# Patient Record
Sex: Male | Born: 1980 | Race: White | Hispanic: No | Smoking: Current every day smoker
Health system: Southern US, Community
[De-identification: ages and names within clinical notes are randomized; demographics above are authoritative.]

## PROBLEM LIST (undated history)

## (undated) DIAGNOSIS — H9190 Unspecified hearing loss, unspecified ear: Secondary | ICD-10-CM

## (undated) DIAGNOSIS — F32A Depression, unspecified: Secondary | ICD-10-CM

## (undated) DIAGNOSIS — F329 Major depressive disorder, single episode, unspecified: Secondary | ICD-10-CM

## (undated) HISTORY — PX: APPENDECTOMY: SHX54

---

## 2001-08-09 ENCOUNTER — Encounter: Payer: Self-pay | Admitting: Emergency Medicine

## 2001-08-09 ENCOUNTER — Emergency Department (HOSPITAL_COMMUNITY): Admission: EM | Admit: 2001-08-09 | Discharge: 2001-08-09 | Payer: Self-pay | Admitting: Emergency Medicine

## 2003-03-03 ENCOUNTER — Emergency Department (HOSPITAL_COMMUNITY): Admission: EM | Admit: 2003-03-03 | Discharge: 2003-03-03 | Payer: Self-pay | Admitting: Emergency Medicine

## 2004-02-25 ENCOUNTER — Emergency Department (HOSPITAL_COMMUNITY): Admission: EM | Admit: 2004-02-25 | Discharge: 2004-02-26 | Payer: Self-pay | Admitting: *Deleted

## 2004-02-26 ENCOUNTER — Ambulatory Visit (HOSPITAL_COMMUNITY): Admission: RE | Admit: 2004-02-26 | Discharge: 2004-02-26 | Payer: Self-pay | Admitting: *Deleted

## 2012-01-06 ENCOUNTER — Telehealth (HOSPITAL_COMMUNITY): Payer: Self-pay | Admitting: Physician Assistant

## 2012-01-06 NOTE — Telephone Encounter (Signed)
Error

## 2016-09-20 ENCOUNTER — Encounter (HOSPITAL_COMMUNITY): Payer: Self-pay | Admitting: Emergency Medicine

## 2016-09-20 ENCOUNTER — Emergency Department (HOSPITAL_COMMUNITY): Payer: BLUE CROSS/BLUE SHIELD

## 2016-09-20 ENCOUNTER — Emergency Department (HOSPITAL_COMMUNITY)
Admission: EM | Admit: 2016-09-20 | Discharge: 2016-09-21 | Disposition: A | Discharge status: Home / Self Care | Payer: BLUE CROSS/BLUE SHIELD | Attending: Emergency Medicine | Admitting: Emergency Medicine

## 2016-09-20 DIAGNOSIS — Y999 Unspecified external cause status: Secondary | ICD-10-CM | POA: Diagnosis not present

## 2016-09-20 DIAGNOSIS — Y939 Activity, unspecified: Secondary | ICD-10-CM | POA: Insufficient documentation

## 2016-09-20 DIAGNOSIS — F17200 Nicotine dependence, unspecified, uncomplicated: Secondary | ICD-10-CM | POA: Insufficient documentation

## 2016-09-20 DIAGNOSIS — Y92410 Unspecified street and highway as the place of occurrence of the external cause: Secondary | ICD-10-CM | POA: Insufficient documentation

## 2016-09-20 DIAGNOSIS — S060X0A Concussion without loss of consciousness, initial encounter: Secondary | ICD-10-CM | POA: Diagnosis not present

## 2016-09-20 DIAGNOSIS — S0990XA Unspecified injury of head, initial encounter: Secondary | ICD-10-CM | POA: Diagnosis present

## 2016-09-20 HISTORY — DX: Unspecified hearing loss, unspecified ear: H91.90

## 2016-09-20 LAB — COMPREHENSIVE METABOLIC PANEL
ALBUMIN: 4.4 g/dL (ref 3.5–5.0)
ALK PHOS: 62 U/L (ref 38–126)
ALT: 21 U/L (ref 17–63)
ANION GAP: 12 (ref 5–15)
AST: 28 U/L (ref 15–41)
BUN: 8 mg/dL (ref 6–20)
CHLORIDE: 98 mmol/L — AB (ref 101–111)
CO2: 24 mmol/L (ref 22–32)
Calcium: 9 mg/dL (ref 8.9–10.3)
Creatinine, Ser: 1.16 mg/dL (ref 0.61–1.24)
GFR calc Af Amer: >60 mL/min (ref >60)
GFR calc non Af Amer: >60 mL/min (ref >60)
GLUCOSE: 102 mg/dL — AB (ref 65–99)
Potassium: 3.5 mmol/L (ref 3.5–5.1)
SODIUM: 134 mmol/L — AB (ref 135–145)
Total Bilirubin: 0.5 mg/dL (ref 0.3–1.2)
Total Protein: 6.9 g/dL (ref 6.5–8.1)

## 2016-09-20 LAB — CBC
HEMATOCRIT: 41.5 % (ref 39.0–52.0)
HEMOGLOBIN: 14.3 g/dL (ref 13.0–17.0)
MCH: 31.6 pg (ref 26.0–34.0)
MCHC: 34.5 g/dL (ref 30.0–36.0)
MCV: 91.8 fL (ref 78.0–100.0)
Platelets: 194 10*3/uL (ref 150–400)
RBC: 4.52 MIL/uL (ref 4.22–5.81)
RDW: 13 % (ref 11.5–15.5)
WBC: 12.2 10*3/uL — ABNORMAL HIGH (ref 4.0–10.5)

## 2016-09-20 MED ORDER — ONDANSETRON 4 MG PO TBDP
4.0000 mg | ORAL_TABLET | Freq: Once | ORAL | Status: AC | PRN
  Administered 2016-09-20: 4 mg via ORAL

## 2016-09-20 MED ORDER — ONDANSETRON 4 MG PO TBDP
ORAL_TABLET | ORAL | Status: AC
  Administered 2016-09-20: 4 mg via ORAL
  Filled 2016-09-20: qty 1

## 2016-09-20 NOTE — ED Notes (Signed)
Interpreter has arrived.

## 2016-09-20 NOTE — ED Notes (Signed)
Interpreter present at this time and notifying pt the he will be moved to room as quickly as possible.

## 2016-09-20 NOTE — ED Notes (Signed)
Pt has a friend here to help with sign interpreting, but the friend has requested we call for an interpreter. Secretary is currently calling for a sign language interpreter.
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
## 2016-09-20 NOTE — ED Notes (Signed)
Contacted sign language interpreter
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
## 2016-09-21 NOTE — ED Notes (Signed)
Pt verbalized understanding of d/c instructions and has no further questions. Pt is stable, A&Ox4, VSS.  

## 2017-01-28 ENCOUNTER — Ambulatory Visit
Admission: RE | Admit: 2017-01-28 | Discharge: 2017-01-28 | Disposition: A | Discharge status: Home / Self Care | Payer: BLUE CROSS/BLUE SHIELD | Source: Ambulatory Visit | Attending: Family Medicine | Admitting: Family Medicine

## 2017-01-28 ENCOUNTER — Other Ambulatory Visit: Payer: Self-pay | Admitting: Family Medicine

## 2017-01-28 DIAGNOSIS — L02415 Cutaneous abscess of right lower limb: Secondary | ICD-10-CM

## 2017-02-25 ENCOUNTER — Other Ambulatory Visit: Payer: Self-pay | Admitting: Family Medicine

## 2017-02-25 DIAGNOSIS — R2241 Localized swelling, mass and lump, right lower limb: Secondary | ICD-10-CM

## 2017-03-03 ENCOUNTER — Ambulatory Visit
Admission: RE | Admit: 2017-03-03 | Discharge: 2017-03-03 | Disposition: A | Discharge status: Home / Self Care | Payer: BLUE CROSS/BLUE SHIELD | Source: Ambulatory Visit | Attending: Family Medicine | Admitting: Family Medicine

## 2017-03-03 DIAGNOSIS — R2241 Localized swelling, mass and lump, right lower limb: Secondary | ICD-10-CM

## 2017-08-01 ENCOUNTER — Emergency Department (HOSPITAL_COMMUNITY)
Admission: EM | Admit: 2017-08-01 | Discharge: 2017-08-02 | Disposition: A | Discharge status: Home / Self Care | Payer: Managed Care, Other (non HMO) | Attending: Emergency Medicine | Admitting: Emergency Medicine

## 2017-08-01 ENCOUNTER — Emergency Department (HOSPITAL_COMMUNITY)
Admission: EM | Admit: 2017-08-01 | Discharge: 2017-08-01 | Discharge status: Against Medical Advice | Payer: BLUE CROSS/BLUE SHIELD

## 2017-08-01 ENCOUNTER — Encounter (HOSPITAL_COMMUNITY): Payer: Self-pay | Admitting: Family Medicine

## 2017-08-01 DIAGNOSIS — F1094 Alcohol use, unspecified with alcohol-induced mood disorder: Secondary | ICD-10-CM | POA: Diagnosis not present

## 2017-08-01 DIAGNOSIS — F329 Major depressive disorder, single episode, unspecified: Secondary | ICD-10-CM | POA: Diagnosis present

## 2017-08-01 DIAGNOSIS — F1290 Cannabis use, unspecified, uncomplicated: Secondary | ICD-10-CM | POA: Diagnosis not present

## 2017-08-01 DIAGNOSIS — F332 Major depressive disorder, recurrent severe without psychotic features: Secondary | ICD-10-CM | POA: Insufficient documentation

## 2017-08-01 DIAGNOSIS — F17210 Nicotine dependence, cigarettes, uncomplicated: Secondary | ICD-10-CM | POA: Diagnosis not present

## 2017-08-01 DIAGNOSIS — F1020 Alcohol dependence, uncomplicated: Secondary | ICD-10-CM | POA: Diagnosis not present

## 2017-08-01 DIAGNOSIS — R45851 Suicidal ideations: Secondary | ICD-10-CM

## 2017-08-01 DIAGNOSIS — F1220 Cannabis dependence, uncomplicated: Secondary | ICD-10-CM | POA: Diagnosis not present

## 2017-08-01 DIAGNOSIS — F1010 Alcohol abuse, uncomplicated: Secondary | ICD-10-CM

## 2017-08-01 HISTORY — DX: Depression, unspecified: F32.A

## 2017-08-01 HISTORY — DX: Major depressive disorder, single episode, unspecified: F32.9

## 2017-08-01 LAB — COMPREHENSIVE METABOLIC PANEL
ALT: 22 U/L (ref 17–63)
AST: 21 U/L (ref 15–41)
Albumin: 4.4 g/dL (ref 3.5–5.0)
Alkaline Phosphatase: 59 U/L (ref 38–126)
Anion gap: 9 (ref 5–15)
BUN: 13 mg/dL (ref 6–20)
CHLORIDE: 107 mmol/L (ref 101–111)
CO2: 25 mmol/L (ref 22–32)
CREATININE: 0.89 mg/dL (ref 0.61–1.24)
Calcium: 9.2 mg/dL (ref 8.9–10.3)
GFR calc non Af Amer: >60 mL/min (ref >60)
Glucose, Bld: 96 mg/dL (ref 65–99)
Potassium: 4.2 mmol/L (ref 3.5–5.1)
SODIUM: 141 mmol/L (ref 135–145)
Total Bilirubin: 0.8 mg/dL (ref 0.3–1.2)
Total Protein: 6.9 g/dL (ref 6.5–8.1)

## 2017-08-01 LAB — CBC
HCT: 40.8 % (ref 39.0–52.0)
HEMOGLOBIN: 14.5 g/dL (ref 13.0–17.0)
MCH: 33.3 pg (ref 26.0–34.0)
MCHC: 35.5 g/dL (ref 30.0–36.0)
MCV: 93.8 fL (ref 78.0–100.0)
Platelets: 202 10*3/uL (ref 150–400)
RBC: 4.35 MIL/uL (ref 4.22–5.81)
RDW: 12.7 % (ref 11.5–15.5)
WBC: 6.2 10*3/uL (ref 4.0–10.5)

## 2017-08-01 LAB — ACETAMINOPHEN LEVEL: Acetaminophen (Tylenol), Serum: <10 ug/mL — ABNORMAL LOW (ref 10–30)

## 2017-08-01 LAB — ETHANOL: Alcohol, Ethyl (B): <10 mg/dL (ref <10)

## 2017-08-01 LAB — SALICYLATE LEVEL: Salicylate Lvl: <7 mg/dL (ref 2.8–30.0)

## 2017-08-01 MED ORDER — NICOTINE 21 MG/24HR TD PT24
21.0000 mg | MEDICATED_PATCH | Freq: Every day | TRANSDERMAL | Status: DC
  Administered 2017-08-01: 21 mg via TRANSDERMAL
  Filled 2017-08-01 (×2): qty 1

## 2017-08-01 MED ORDER — LORAZEPAM 1 MG PO TABS
0.0000 mg | ORAL_TABLET | Freq: Four times a day (QID) | ORAL | Status: DC
  Administered 2017-08-02: 1 mg via ORAL
  Filled 2017-08-01: qty 1

## 2017-08-01 MED ORDER — VITAMIN B-1 100 MG PO TABS
100.0000 mg | ORAL_TABLET | Freq: Every day | ORAL | Status: DC
  Administered 2017-08-02: 100 mg via ORAL
  Filled 2017-08-01: qty 1

## 2017-08-01 MED ORDER — FOLIC ACID 1 MG PO TABS
1.0000 mg | ORAL_TABLET | Freq: Every day | ORAL | Status: DC
  Administered 2017-08-02: 1 mg via ORAL
  Filled 2017-08-01: qty 1

## 2017-08-01 MED ORDER — LORAZEPAM 1 MG PO TABS: 1.0000 mg | ORAL_TABLET | Freq: Four times a day (QID) | ORAL | Status: DC | PRN

## 2017-08-01 MED ORDER — LORAZEPAM 1 MG PO TABS: 0.0000 mg | ORAL_TABLET | Freq: Two times a day (BID) | ORAL | Status: DC

## 2017-08-01 MED ORDER — LORAZEPAM 2 MG/ML IJ SOLN: 1.0000 mg | Freq: Four times a day (QID) | INTRAMUSCULAR | Status: DC | PRN

## 2017-08-01 MED ORDER — THIAMINE HCL 100 MG/ML IJ SOLN: 100.0000 mg | Freq: Every day | INTRAMUSCULAR | Status: DC

## 2017-08-01 MED ORDER — ADULT MULTIVITAMIN W/MINERALS CH
1.0000 | ORAL_TABLET | Freq: Every day | ORAL | Status: DC
  Administered 2017-08-02: 1 via ORAL
  Filled 2017-08-01: qty 1

## 2017-08-01 NOTE — ED Triage Notes (Signed)
Went to triage patient while two other individuals were in the room who are also using sign language. Was going to use [employer] video, which is the serve that [facility] uses. Initially started to use [employer] video, Janeses #[id number]. The interpreter was interrupted by one of the individuals in the room, reporting the video interpreter was not correct with signing. Removed the interpreter video out of the room, spoke with Toniann FailWendy, RN Merchandiser, retail(assistance director) to speak with patient and the other individuals.

## 2017-08-01 NOTE — ED Provider Notes (Signed)
[facility]-EMERGENCY DEPT Provider Note   CSN: [id number] Arrival date & time: 08/01/17  1803     History   Chief Complaint Chief Complaint  Patient presents with  . Psychiatric Evaluation    HPI Jorge ForthDavid M Young is a 37 y.o. male.  Patient with history of depression and deafness presenting with suicidal thoughts and depression and hopelessness.  States he is feeling hopeless and has recently broke up with his wife.  He has been off of his Zoloft for the past month.  He has been abusing alcohol on a regular basis and drinking heavily.  States he drinks about 3 16ounce drinks of 10% alcohol per day, last drink was at 5 PM.  He denies any withdrawal symptoms, shaking or vomiting.  Denies any IV drug use.  Abuse some pain killers 4 days ago but none recently.  Complains of a headache now.  No chest pain or abdominal pain.  No vomiting or diarrhea.   The history is provided by the patient. The history is limited by a language barrier. A language interpreter was used.    Past Medical History:  Diagnosis Date  . Deaf   . Depression     There are no active problems to display for this patient.   Past Surgical History:  Procedure Laterality Date  . APPENDECTOMY         Home Medications    Prior to Admission medications   Not on File    Family History History reviewed. No pertinent family history.  Social History Social History   Tobacco Use  . Smoking status: Current Every Day Smoker    Packs/day: 1.00    Types: Cigarettes  . Smokeless tobacco: Never Used  Substance Use Topics  . Alcohol use: Yes    Comment: Daily. 3 16oz beer a day. Last drink: 17:00   . Drug use: Yes    Types: Marijuana    Comment: Hydrocodone from streets, last used Thursday night. Last used Marjuana 14:00 today.      Allergies   Patient has no allergy information on record.   Review of Systems Review of Systems  Constitutional: Negative for activity change,  appetite change, fatigue and fever.  HENT: Negative for congestion and rhinorrhea.   Eyes: Negative for visual disturbance.  Respiratory: Negative for cough, chest tightness and shortness of breath.   Cardiovascular: Negative for chest pain.  Gastrointestinal: Positive for nausea. Negative for abdominal pain and vomiting.  Genitourinary: Negative for dysuria and hematuria.  Musculoskeletal: Negative for arthralgias and myalgias.  Skin: Negative for rash.  Neurological: Positive for headaches. Negative for dizziness and weakness.  Psychiatric/Behavioral: Positive for decreased concentration, dysphoric mood, self-injury, sleep disturbance and suicidal ideas. The patient is nervous/anxious.     all other systems are negative except as noted in the HPI and PMH.    Physical Exam Updated Vital Signs BP 136/89 (BP Location: Right Arm)   Pulse 88   Temp 98.7 F (37.1 C) (Oral)   Resp 18   Ht 5\' 11"  (1.803 m)   Wt 80.7 kg (178 lb)   SpO2 97%   BMI 24.83 kg/m   Physical Exam  Constitutional: He is oriented to person, place, and time. He appears well-developed and well-nourished. No distress.  Calm and cooperative. No tremors.  HENT:  Head: Normocephalic and atraumatic.  Mouth/Throat: Oropharynx is clear and moist. No oropharyngeal exudate.  Eyes: Conjunctivae and EOM are normal. Pupils are equal, round, and reactive to light.  Neck: Normal range of motion. Neck supple.  No meningismus.  Cardiovascular: Normal rate, regular rhythm, normal heart sounds and intact distal pulses.  No murmur heard. Pulmonary/Chest: Effort normal and breath sounds normal. No respiratory distress. He exhibits no tenderness.  Abdominal: Soft. There is no tenderness. There is no rebound and no guarding.  Musculoskeletal: Normal range of motion. He exhibits no edema or tenderness.  Neurological: He is alert and oriented to person, place, and time. No cranial nerve deficit. He exhibits normal muscle tone.  Coordination normal.  No ataxia on finger to nose bilaterally. No pronator drift. 5/5 strength throughout. CN 2-12 intact.Equal grip strength. Sensation intact.   Skin: Skin is warm. Capillary refill takes less than 2 seconds.  Psychiatric: He has a normal mood and affect. His behavior is normal.  Nursing note and vitals reviewed.    ED Treatments / Results  Labs (all labs ordered are listed, but only abnormal results are displayed) Labs Reviewed  ACETAMINOPHEN LEVEL - Abnormal; Notable for the following components:      Result Value   Acetaminophen (Tylenol), Serum <10 (*)    All other components within normal limits  RAPID URINE DRUG SCREEN, HOSP PERFORMED - Abnormal; Notable for the following components:   Tetrahydrocannabinol POSITIVE (*)    All other components within normal limits  COMPREHENSIVE METABOLIC PANEL  ETHANOL  SALICYLATE LEVEL  CBC    EKG  EKG Interpretation None       Radiology No results found.  Procedures Procedures (including critical care time)  Medications Ordered in ED Medications - No data to display   Initial Impression / Assessment and Plan / ED Course  I have reviewed the triage vital signs and the nursing notes.  Pertinent labs & imaging results that were available during my care of the patient were reviewed by me and considered in my medical decision making (see chart for details).    Patient with depression and vague suicidal thoughts with alcohol abuse.  There is no evidence of alcohol withdrawal at this time.  Will obtain screening labs, alcohol withdrawal protocol  Screening labs are unremarkable.  Drug screen is positive for marijuana.  Patient is medically clear for TTS consult.  Holding orders placed.  Alcohol withdrawal orders placed.  No evidence of life-threatening withdrawal at this time.    Final Clinical Impressions(s) / ED Diagnoses   Final diagnoses:  Suicidal ideation  Alcohol abuse    ED Discharge Orders     None       Hayley Horn, Jeannett Senior, MD 08/02/17 [phone]

## 2017-08-01 NOTE — ED Notes (Signed)
Bed: WA28 Expected date:  Expected time:  Means of arrival:  Comments: 

## 2017-08-01 NOTE — ED Notes (Signed)
Bed: WLPT4 Expected date:  Expected time:  Means of arrival:  Comments: 

## 2017-08-02 DIAGNOSIS — F17210 Nicotine dependence, cigarettes, uncomplicated: Secondary | ICD-10-CM | POA: Diagnosis not present

## 2017-08-02 DIAGNOSIS — F332 Major depressive disorder, recurrent severe without psychotic features: Secondary | ICD-10-CM | POA: Diagnosis not present

## 2017-08-02 DIAGNOSIS — F1290 Cannabis use, unspecified, uncomplicated: Secondary | ICD-10-CM

## 2017-08-02 DIAGNOSIS — F1094 Alcohol use, unspecified with alcohol-induced mood disorder: Secondary | ICD-10-CM | POA: Diagnosis not present

## 2017-08-02 LAB — RAPID URINE DRUG SCREEN, HOSP PERFORMED
Amphetamines: NOT DETECTED
Barbiturates: NOT DETECTED
Benzodiazepines: NOT DETECTED
COCAINE: NOT DETECTED
OPIATES: NOT DETECTED
Tetrahydrocannabinol: POSITIVE — AB

## 2017-08-02 MED ORDER — ACETAMINOPHEN 325 MG PO TABS
650.0000 mg | ORAL_TABLET | ORAL | Status: DC | PRN
  Administered 2017-08-02: 650 mg via ORAL

## 2017-08-02 MED ORDER — ALUM & MAG HYDROXIDE-SIMETH 200-200-20 MG/5ML PO SUSP: 30.0000 mL | Freq: Four times a day (QID) | ORAL | Status: DC | PRN

## 2017-08-02 MED ORDER — ONDANSETRON HCL 4 MG PO TABS: 4.0000 mg | ORAL_TABLET | Freq: Three times a day (TID) | ORAL | Status: DC | PRN

## 2017-08-02 MED ORDER — NICOTINE 21 MG/24HR TD PT24
21.0000 mg | MEDICATED_PATCH | Freq: Every day | TRANSDERMAL | Status: DC
  Administered 2017-08-02: 21 mg via TRANSDERMAL

## 2017-08-02 NOTE — ED Notes (Signed)
Report given to ashley RN.

## 2017-08-02 NOTE — BH Assessment (Signed)
[facility]  Per Juanetta BeetsJacqueline Norman, DO, this pt does not require psychiatric hospitalization at this time.  [facility]  Pt would benefit from seeing Peer Support Specialists; they will be asked to speak to pt.  Pt's nurse, Morrie Sheldonshley, has been notified.  Doylene Canninghomas Eduardo Honor, MA Triage Specialist [phone]

## 2017-08-02 NOTE — ED Notes (Signed)
ED Provider at bedside psych team with interpreter; plan is to get peer support involved and hope to find placement for detox from [facility]

## 2017-08-02 NOTE — ED Notes (Signed)
Pt d/c home per MD order. Interpretor at bedside to assist with review of discharge summary. Pt verbalizes understanding. Pt denies SI/HI. Personal property returned to pt. Pt signed e-signature. Reports sister is on way to pick up. Pt ambulatory off unit with interpretor and MHT.

## 2017-08-02 NOTE — ED Notes (Signed)
interpretor called , reports will be here in 10 mins

## 2017-08-02 NOTE — ED Notes (Signed)
This nurse, interpretor, peer support in day room to do assessment. Pt denies SI/HI. Asking for detox and wanting to go to [facility]. Pt denies withdrawal symptoms. Special checks q 15 mins in place for safety, video monitoring in place. Will continue to monitor.

## 2017-08-02 NOTE — ED Notes (Signed)
Bed: WBH40 Expected date:  Expected time:  Means of arrival:  Comments: 28 

## 2017-08-02 NOTE — Consult Note (Addendum)
[facility] Psychiatry Consult   Reason for Consult:  Suicidal thoughts  Referring Physician:  EDP Patient Identification: Jorge Young MRN:  [id number] Principal Diagnosis: Alcohol-induced mood disorder with depressive symptoms [facility]) Diagnosis:   Patient Active Problem List   Diagnosis Date Noted  . Alcohol-induced mood disorder with depressive symptoms ([place]) [F10.94] 08/02/2017    Total Time spent with patient: 30 minutes  HPI:   Jorge Young is a 37 y.o. male patient who is deaf and mute admitted to the ED requesting assistance in overcoming his alcohol addiction. Patient notes that initially he was having suicidal thoughts with increasing depression and hopelessness over the last month. Patient notes that his main stressor for his hopeless and depression has been related to recently breaking up with his wife and the separation that has coincided. He has been off of his Zoloft for the past 3 months. He has been abusing alcohol on a regular basis and drinking heavily. Patient also notes that he has been taking 1-2 Hydrocodone tablets of unknown strength with his beer. Patient endorses blacking out frequently with drinking and feels like this issue is getting bad enough for him to feel that he needs to get this issue taken care of and addressed. He denies any withdrawal symptoms, shaking or vomiting at this time. Denies any IV drug use. Denies chest pain or abdominal pain.  No vomiting or diarrhea. Patient has had prior alcohol rehabilitation in March of 2017 in [place] that worked specifically with the deaf community. Patient states that he has been trying to get into [facility] but was unable to wait until Friday when they said that they would accept him as he was scared that he just had too much access to beer and feared to black out again and use poor judgment.   Patient history taken with the assistance of Elta Guadeloupe who was able to use American Sign Language to interpret.  Past  Psychiatric History:  Major Depressive Disorder Alcohol Use Disorder  Risk to Self: None  Risk to Others:None Prior Inpatient Therapy: Prior Inpatient Therapy: Yes Prior Therapy Dates: March of 2017 Prior Therapy Facilty/Provider(s): A facility in [place] Reason for Treatment: rehab Prior Outpatient Therapy: Prior Outpatient Therapy: Yes Prior Therapy Dates: 4 years to current Prior Therapy Facilty/Provider(s): [facility] in [place] / AA meetings Reason for Treatment: med monitoring and therapy Does patient have an ACCT team?: No Does patient have Intensive In-House Services?  : No Does patient have [facility] services? : No Does patient have P4CC services?: No  Past Medical History:  Past Medical History:  Diagnosis Date  . Deaf   . Depression     Past Surgical History:  Procedure Laterality Date  . APPENDECTOMY     Family History: History reviewed. No pertinent family history.  Social History:  Social History   Substance and Sexual Activity  Alcohol Use Yes   Comment: Daily. 3 16oz beer a day. Last drink: 17:00      Social History   Substance and Sexual Activity  Drug Use Yes  . Types: Marijuana   Comment: Hydrocodone from streets, last used Thursday night. Last used Marjuana 14:00 today.     Social History   Socioeconomic History  . Marital status: Legally Separated    Spouse name: None  . Number of children: None  . Years of education: None  . Highest education level: None  Social Needs  . Financial resource strain: None  . Food insecurity - worry: None  .  Food insecurity - inability: None  . Transportation needs - medical: None  . Transportation needs - non-medical: None  Occupational History  . None  Tobacco Use  . Smoking status: Current Every Day Smoker    Packs/day: 1.00    Types: Cigarettes  . Smokeless tobacco: Never Used  Substance and Sexual Activity  . Alcohol use: Yes    Comment: Daily. 3 16oz beer a day. Last drink: 17:00   . Drug use: Yes     Types: Marijuana    Comment: Hydrocodone from streets, last used Thursday night. Last used Marjuana 14:00 today.   Marland Kitchen Sexual activity: None  Other Topics Concern  . None  Social History Narrative  . None   Allergies:  No Known Allergies  Labs:  Results for orders placed or performed during the hospital encounter of 08/01/17 (from the past 48 hour(s))  Comprehensive metabolic panel     Status: None   Collection Time: 08/01/17 10:53 PM  Result Value Ref Range   Sodium 141 135 - 145 mmol/L   Potassium 4.2 3.5 - 5.1 mmol/L   Chloride 107 101 - 111 mmol/L   CO2 25 22 - 32 mmol/L   Glucose, Bld 96 65 - 99 mg/dL   BUN 13 6 - 20 mg/dL   Creatinine, Ser 0.89 0.61 - 1.24 mg/dL   Calcium 9.2 8.9 - 10.3 mg/dL   Total Protein 6.9 6.5 - 8.1 g/dL   Albumin 4.4 3.5 - 5.0 g/dL   AST 21 15 - 41 U/L   ALT 22 17 - 63 U/L   Alkaline Phosphatase 59 38 - 126 U/L   Total Bilirubin 0.8 0.3 - 1.2 mg/dL   GFR calc non Af Amer >60 >60 mL/min   GFR calc Af Amer >60 >60 mL/min    Comment: (NOTE) The eGFR has been calculated using the CKD EPI equation. This calculation has not been validated in all clinical situations. eGFR's persistently <60 mL/min signify possible Chronic Kidney Disease.    Anion gap 9 5 - 15    Comment: Performed at [facility], [place] [address], [place], [place] [address]  Ethanol     Status: None   Collection Time: 08/01/17 10:53 PM  Result Value Ref Range   Alcohol, Ethyl (B) <10 <10 mg/dL    Comment:        LOWEST DETECTABLE LIMIT FOR SERUM ALCOHOL IS 10 mg/dL FOR MEDICAL PURPOSES ONLY Performed at [facility] [address] [place], [place] [address]   Salicylate level     Status: None   Collection Time: 08/01/17 10:53 PM  Result Value Ref Range   Salicylate Lvl <0.2 2.8 - 30.0 mg/dL    Comment: Performed at [facility], [place] [address], [place], [place] [address]  Acetaminophen level     Status: Abnormal    Collection Time: 08/01/17 10:53 PM  Result Value Ref Range   Acetaminophen (Tylenol), Serum <10 (L) 10 - 30 ug/mL    Comment:        THERAPEUTIC CONCENTRATIONS VARY SIGNIFICANTLY. A RANGE OF 10-30 ug/mL MAY BE AN EFFECTIVE CONCENTRATION FOR MANY PATIENTS. HOWEVER, SOME ARE BEST TREATED AT CONCENTRATIONS OUTSIDE THIS RANGE. ACETAMINOPHEN CONCENTRATIONS >150 ug/mL AT 4 HOURS AFTER INGESTION AND >50 ug/mL AT 12 HOURS AFTER INGESTION ARE OFTEN ASSOCIATED WITH TOXIC REACTIONS. Performed at [facility], [place] [address], [place], [place] [address]   cbc     Status: None   Collection  Time: 08/01/17 10:53 PM  Result Value Ref Range   WBC 6.2 4.0 - 10.5 K/uL   RBC 4.35 4.22 - 5.81 MIL/uL   Hemoglobin 14.5 13.0 - 17.0 g/dL   HCT 40.8 39.0 - 52.0 %   MCV 93.8 78.0 - 100.0 fL   MCH 33.3 26.0 - 34.0 pg   MCHC 35.5 30.0 - 36.0 g/dL   RDW 12.7 11.5 - 15.5 %   Platelets 202 150 - 400 K/uL    Comment: Performed at [facility], [place] [address], [place], [place] [address]  Rapid urine drug screen (hospital performed)     Status: Abnormal   Collection Time: 08/02/17 12:30 AM  Result Value Ref Range   Opiates NONE DETECTED NONE DETECTED   Cocaine NONE DETECTED NONE DETECTED   Benzodiazepines NONE DETECTED NONE DETECTED   Amphetamines NONE DETECTED NONE DETECTED   Tetrahydrocannabinol POSITIVE (A) NONE DETECTED   Barbiturates NONE DETECTED NONE DETECTED    Comment: (NOTE) DRUG SCREEN FOR MEDICAL PURPOSES ONLY.  IF CONFIRMATION IS NEEDED FOR ANY PURPOSE, NOTIFY LAB WITHIN 5 DAYS. LOWEST DETECTABLE LIMITS FOR URINE DRUG SCREEN Drug Class                     Cutoff (ng/mL) Amphetamine and metabolites    1000 Barbiturate and metabolites    200 Benzodiazepine                 270 Tricyclics and metabolites     300 Opiates and metabolites        300 Cocaine and metabolites        300 THC                            50 Performed at [facility], [place] [address] [place], [place] [address]     Current Facility-Administered Medications  Medication Dose Route Frequency Provider Last Rate Last Dose  . acetaminophen (TYLENOL) tablet 650 mg  650 mg Oral Q4H PRN Ezequiel Essex, MD   650 mg at 08/02/17 0055  . alum & mag hydroxide-simeth (MAALOX/MYLANTA) 200-200-20 MG/5ML suspension 30 mL  30 mL Oral Q6H PRN Rancour, Stephen, MD      . folic acid (FOLVITE) tablet 1 mg  1 mg Oral Daily Rancour, Stephen, MD   1 mg at 08/02/17 1004  . LORazepam (ATIVAN) tablet 1 mg  1 mg Oral Q6H PRN Ezequiel Essex, MD       Or  . LORazepam (ATIVAN) injection 1 mg  1 mg Intravenous Q6H PRN Rancour, Stephen, MD      . LORazepam (ATIVAN) tablet 0-4 mg  0-4 mg Oral Q6H Rancour, Stephen, MD   1 mg at 08/02/17 0046   Followed by  . [START ON 08/03/2017] LORazepam (ATIVAN) tablet 0-4 mg  0-4 mg Oral Q12H Rancour, Stephen, MD      . multivitamin with minerals tablet 1 tablet  1 tablet Oral Daily Rancour, Stephen, MD   1 tablet at 08/02/17 1004  . nicotine (NICODERM CQ - dosed in mg/24 hours) patch 21 mg  21 mg Transdermal Daily Rancour, Stephen, MD   21 mg at 08/02/17 1004  . ondansetron (ZOFRAN) tablet 4 mg  4 mg Oral Q8H PRN Rancour, Stephen, MD      . thiamine (VITAMIN B-1) tablet 100 mg  100 mg Oral Daily Rancour, Stephen, MD   100 mg at 08/02/17 1004  Or  . thiamine (B-1) injection 100 mg  100 mg Intravenous Daily Rancour, Stephen, MD       Current Outpatient Medications  Medication Sig Dispense Refill  . sertraline (ZOLOFT) 50 MG tablet Take 1 tablet by mouth daily.      Musculoskeletal: Strength & Muscle Tone: within normal limits Gait & Station: normal Patient leans: N/A  Psychiatric Specialty Exam: Physical Exam  Nursing note and vitals reviewed. Constitutional: He is oriented to person, place, and time. He appears well-developed and well-nourished.  HENT:  Head: Normocephalic and atraumatic.  Neck: Normal range of motion.   Respiratory: Effort normal.  Musculoskeletal: Normal range of motion.  Neurological: He is alert and oriented to person, place, and time. He has normal strength.  Skin: Skin is warm and dry.  Psychiatric: He has a normal mood and affect. His behavior is normal. Judgment and thought content normal. Cognition and memory are normal.  Patient is deaf and mute and communicates clearly with sign language    Review of Systems  Psychiatric/Behavioral: Positive for depression and substance abuse. Negative for hallucinations and suicidal ideas. The patient is not nervous/anxious.   All other systems reviewed and are negative.   Blood pressure 121/74, pulse 71, temperature 98.5 F (36.9 C), resp. rate 16, height '5\' 11"'$  (1.803 m), weight 80.7 kg (178 lb), SpO2 100 %.Body mass index is 24.83 kg/m.  General Appearance: Casual  Eye Contact:  Good  Speech:  NA  Volume:  Patient is mute  Mood:  Euthymic  Affect:  Congruent  Thought Process:  Coherent  Orientation:  Full (Time, Place, and Person)  Thought Content:  Logical  Suicidal Thoughts:  No  Homicidal Thoughts:  No  Memory:  Immediate;   Good Recent;   Good Remote;   Good  Judgement:  Good  Insight:  Good  Psychomotor Activity:  Normal  Concentration:  Concentration: Good and Attention Span: Good  Recall:  Good  Fund of Knowledge:  Good  Language:  Good  Akathisia:  No  Handed:  Right  AIMS (if indicated):   N/A  Assets:  Communication Skills Desire for Improvement Financial Resources/Insurance Housing Physical Health Resilience Social Support Vocational/Educational  ADL's:  Intact  Cognition:  WNL  Sleep:   N/A     Treatment Plan Summary: Plan Alcohol-induced mood disorder with depressive symptoms ([place])  Discharge Home  Follow up with [facility] for substance abuse treatment on Friday Take all medications as prescribed Avoid the use of alcohol and illicit drugs  Disposition: No evidence of imminent risk to self  or others at present.   Patient does not meet criteria for psychiatric inpatient admission. Supportive therapy provided about ongoing stressors.  Ethelene Hal, NP 08/02/2017 12:24 PM   Patient seen face-to-face for psychiatric evaluation, chart reviewed and case discussed with the physician extender and developed treatment plan. Reviewed the information documented and agree with the treatment plan.  Buford Dresser, DO 08/02/17 12:53 PM

## 2017-08-02 NOTE — ED Notes (Signed)
SBAR Report received from previous nurse. Pt received calm and visible on unit. Pt denies current SI/ HI, A/V H, depression, or anxiety at this time, and appears otherwise stable and free of distress. Pt endorses pain 6/10 headache at this time. Will continue to assess.

## 2017-08-02 NOTE — ED Notes (Addendum)
Interpreter services notified of the need for a medical interpreter in the morning at 830. Writer established that pt can read and write, so basic communication is available before that.

## 2017-08-02 NOTE — ED Notes (Signed)
Denies SI/HI

## 2017-08-02 NOTE — ED Notes (Signed)
Greg CutterMark Lineberger-Interpreter of sign lanuage [phone] Agency #  [phone]

## 2017-08-02 NOTE — [facility] Suicide Risk Assessment (Signed)
Suicide Risk Assessment  Discharge Assessment   [facility]   Principal Problem: Alcohol-induced mood disorder with depressive symptoms [facility](HCC) Discharge Diagnoses:  Patient Active Problem List   Diagnosis Date Noted  . Alcohol-induced mood disorder with depressive symptoms ([facility]) [F10.94] 08/02/2017    Total Time spent with patient: 45 minutes  Musculoskeletal: Strength & Muscle Tone: within normal limits Gait & Station: normal Patient leans: N/A  Psychiatric Specialty Exam: Physical Exam  Constitutional: He is oriented to person, place, and time. He appears well-developed and well-nourished.  HENT:  Head: Normocephalic.  Neck: Normal range of motion.  Neurological: He is alert and oriented to person, place, and time. He has normal strength.  Skin: Skin is warm and dry.  Psychiatric: He has a normal mood and affect. His behavior is normal. Judgment and thought content normal. Cognition and memory are normal.  Patient is deaf and mute and communicates clearly with sign language    ROS  Blood pressure 121/74, pulse 71, temperature 98.5 F (36.9 C), resp. rate 16, height 5\' 11"  (1.803 m), weight 80.7 kg (178 lb), SpO2 100 %.Body mass index is 24.83 kg/m.  General Appearance: Casual  Eye Contact:  Good  Speech:  NA  Volume:  Patient is mute  Mood:  Euthymic  Affect:  Congruent  Thought Process:  Coherent  Orientation:  Full (Time, Place, and Person)  Thought Content:  Logical  Suicidal Thoughts:  No  Homicidal Thoughts:  No  Memory:  Immediate;   Good Recent;   Good Remote;   Good  Judgement:  Good  Insight:  Good  Psychomotor Activity:  Normal  Concentration:  Concentration: Good and Attention Span: Good  Recall:  Good  Fund of Knowledge:  Good  Language:  Good  Akathisia:  No  Handed:  Right  AIMS (if indicated):     Assets:  Communication Skills Desire for Improvement Financial Resources/Insurance Housing Physical  Health Resilience Social Support Vocational/Educational  ADL's:  Intact  Cognition:  WNL    Mental Status Per Nursing Assessment::   On Admission:   Suicidal thoughts and depression  Demographic Factors:  Male and Caucasian  Loss Factors: Legal issues and Financial problems/change in socioeconomic status  Historical Factors: Impulsivity  Risk Reduction Factors:   Sense of responsibility to family and Employed  Continued Clinical Symptoms:  Depression:   Impulsivity Alcohol/Substance Abuse/Dependencies  Cognitive Features That Contribute To Risk:  Closed-mindedness    Suicide Risk:  Minimal: No identifiable suicidal ideation.  Patients presenting with no risk factors but with morbid ruminations; may be classified as minimal risk based on the severity of the depressive symptoms    Plan Of Care/Follow-up recommendations:  Activity:  as tolerated Diet:  Heart Healthy  Laveda AbbeLaurie Britton Ayeshia Coppin, NP 08/02/2017, 12:28 PM

## 2017-08-02 NOTE — BH Assessment (Signed)
Assessment Note  Jorge Young is an 37 y.o. male.  -Clinician reviewed note by Dr. Manus Gunning.  Patient with history of depression and deafness presenting with suicidal thoughts and depression and hopelessness.  States he is feeling hopeless and has recently broke up with his wife.  He has been off of his Zoloft for the past month.  He has been abusing alcohol on a regular basis and drinking heavily.  States he drinks about three 16ounce 10% alcohol per day, last drink was at 5 PM.  He denies any withdrawal symptoms, shaking or vomiting.  Denies any IV drug use.  Abuse some pain killers 4 days ago but none recently.  Patient was accompanied by a [place] deaf interpreter using ASL.  Patient says he was feeling very depressed this afternoon.  He got in touch with counselor at [facility] and was recommended to come in for evaluation at [facility].  Patient did come in to [facility] and left around 13:00 then returned later.   He says he has been very depressed since he and wife separated during the first week of March.  He has moved in with a friend.  Wife left because he was drinking a lot and using marijuana.  In the last four weeks he was using hydrocodone pills along with the ETOH.  Patient says that the last time he used pain killers was on 02/28.    Patient has had some SI but has no current plan.  Patient has no previous suicide attempts.  Patient however says he does not feel safe by himself.    Patient denies any HI or A/V hallucinations.  Patient went to a facility in [place] in March of 2017.  He was sober for 11 months after that then he relapsed.  He says he got a DUI in April of 2018 and he has a restricted license now.  Patient says over the last month his use of marijuana and ETOH have only increased.  He says he thinks about it a lot.  Patient says he goes to [employer] off and on.  He goes to [facility] for psychiatry and therapy.  He says that he was on Zoloft but stopped taking about a month ago  because he felt it was no helping.  -Clinician discussed patient care with Donell Sievert, PA who said that patient should be observed overnight for safety and reviewed by psychiatry in AM.  Clinician let nurse Bosie Clos know of disposition.  Diagnosis: F33.2 MDD recurrent severe; F10.20 ETOH use d/o severe; F12.20 Cannabis use d/o severe  Past Medical History:  Past Medical History:  Diagnosis Date  . Deaf   . Depression     Past Surgical History:  Procedure Laterality Date  . APPENDECTOMY      Family History: History reviewed. No pertinent family history.  Social History:  reports that he has been smoking cigarettes.  He has been smoking about 1.00 pack per day. he has never used smokeless tobacco. He reports that he drinks alcohol. He reports that he uses drugs. Drug: Marijuana.  Additional Social History:  Alcohol / Drug Use Pain Medications: None Prescriptions: Zoloft, last dose a month ago. Over the Counter: Advil or Tylenol.  Sometimes Advil PM History of alcohol / drug use?: Yes Negative Consequences of Use: Personal relationships Withdrawal Symptoms: (No reported symptoms) Substance #1 Name of Substance 1: ETOH (beer) 1 - Age of First Use: 37 years of age 35 - Amount (size/oz): Two to three of the  16oz cans with 10% alcohol content drinks daily 1 - Frequency: Daily for the last 5 years 1 - Duration: Last five years 1 - Last Use / Amount: 03/04 drank four cans Substance #2 Name of Substance 2: Marijuana 2 - Age of First Use: 37 years of age 72 - Amount (size/oz): Bowl per day (gram will last a couple of weeks) 2 - Frequency: Daily use 2 - Duration: on-going 2 - Last Use / Amount: 03/04 Substance #3 Name of Substance 3: Hydrocodone 3 - Amount (size/oz): Pt not sure of the dosage on the pills.  Usually will take one pill every few days with EToH. 3 - Frequency: Every few days 3 - Duration: on-going 3 - Last Use / Amount: Last Thursday02/28/19  CIWA: CIWA-Ar BP:  136/89 Pulse Rate: 88 Nausea and Vomiting: no nausea and no vomiting Tactile Disturbances: none Tremor: no tremor Auditory Disturbances: not present Paroxysmal Sweats: no sweat visible Visual Disturbances: not present Anxiety: two Headache, Fullness in Head: moderate Agitation: normal activity Orientation and Clouding of Sensorium: oriented and can do serial additions CIWA-Ar Total: 5 COWS:    Allergies: No Known Allergies  Home Medications:  (Not in a hospital admission)  OB/GYN Status:  No LMP for male patient.  General Assessment Data Location of Assessment: [facility] TTS Assessment: In system Is this a Tele or Face-to-Face Assessment?: Face-to-Face Is this an Initial Assessment or a Re-assessment for this encounter?: Initial Assessment Marital status: Separated Is patient pregnant?: No Pregnancy Status: No Living Arrangements: Non-relatives/Friends(Lives with friend since first week of February.) Can pt return to current living arrangement?: Yes Admission Status: Voluntary Is patient capable of signing voluntary admission?: Yes Referral Source: Self/Family/Friend Insurance type: Self pay     Crisis Care Plan Living Arrangements: Non-relatives/Friends(Lives with friend since first week of February.) Name of Psychiatrist: [facility] in [place] Name of Therapist: [facility]  Education Status Is patient currently in school?: No Highest grade of school patient has completed: some college  Risk to self with the past 6 months Suicidal Ideation: Yes-Currently Present Has patient been a risk to self within the past 6 months prior to admission? : Yes Suicidal Intent: No Has patient had any suicidal intent within the past 6 months prior to admission? : Yes Is patient at risk for suicide?: Yes Suicidal Plan?: No Has patient had any suicidal plan within the past 6 months prior to admission? : No Access to Means: No What has been your use of drugs/alcohol within the last 12 months?:  ETOH & marijuana Previous Attempts/Gestures: No How many times?: 0 Other Self Harm Risks: NOne Triggers for Past Attempts: None known Intentional Self Injurious Behavior: None Family Suicide History: No Recent stressful life event(s): Turmoil (Comment), Divorce(Wife and he separated first week of Februry) Persecutory voices/beliefs?: No Depression: Yes Depression Symptoms: Despondent, Insomnia, Guilt, Feeling worthless/self pity, Loss of interest in usual pleasures, Isolating Substance abuse history and/or treatment for substance abuse?: Yes Suicide prevention information given to non-admitted patients: Not applicable  Risk to Others within the past 6 months Homicidal Ideation: No Does patient have any lifetime risk of violence toward others beyond the six months prior to admission? : No Thoughts of Harm to Others: No Current Homicidal Intent: No Current Homicidal Plan: No Access to Homicidal Means: No Identified Victim: No one History of harm to others?: No Assessment of Violence: None Noted Violent Behavior Description: None reported Does patient have access to weapons?: No Criminal Charges Pending?: No Does patient have a  court date: No Is patient on probation?: No  Psychosis Hallucinations: None noted Delusions: None noted  Mental Status Report Appearance/Hygiene: Unremarkable, In scrubs Eye Contact: Good Motor Activity: Freedom of movement, Unremarkable Speech: Language other than English(American Sign Language) Level of Consciousness: Alert Mood: Depressed, Anxious, Despair, Helpless, Sad Affect: Sad, Depressed, Anxious Anxiety Level: Panic Attacks Panic attack frequency: Last week Most recent panic attack: Last Thursday  Thought Processes: Coherent, Relevant Judgement: Impaired Orientation: Person, Place, Time, Situation Obsessive Compulsive Thoughts/Behaviors: None  Cognitive Functioning Concentration: Fair Memory: Recent Impaired, Remote Intact IQ:  Average Insight: Fair Impulse Control: Fair Appetite: Poor Weight Loss: 0 Weight Gain: 0 Sleep: Decreased Total Hours of Sleep: 6 Vegetative Symptoms: None  ADLScreening [facility] Assessment Services) Patient's cognitive ability adequate to safely complete daily activities?: Yes Patient able to express need for assistance with ADLs?: Yes Independently performs ADLs?: Yes (appropriate for developmental age)  Prior Inpatient Therapy Prior Inpatient Therapy: Yes Prior Therapy Dates: March of 2017 Prior Therapy Facilty/Provider(s): [place] Reason for Treatment: rehab  Prior Outpatient Therapy Prior Outpatient Therapy: Yes Prior Therapy Dates: 4 years to current Prior Therapy Facilty/Provider(s): [facility] in [place] / AA meetings Reason for Treatment: med monitoring and therapy Does patient have an ACCT team?: No Does patient have Intensive In-House Services?  : No Does patient have [facility] services? : No Does patient have P4CC services?: No  ADL Screening (condition at time of admission) Patient's cognitive ability adequate to safely complete daily activities?: Yes Is the patient deaf or have difficulty hearing?: Yes(Needs interpreter.) Does the patient have difficulty seeing, even when wearing glasses/contacts?: No Does the patient have difficulty concentrating, remembering, or making decisions?: Yes Patient able to express need for assistance with ADLs?: Yes Does the patient have difficulty dressing or bathing?: No Independently performs ADLs?: Yes (appropriate for developmental age) Does the patient have difficulty walking or climbing stairs?: No Weakness of Legs: None Weakness of Arms/Hands: None       Abuse/Neglect Assessment (Assessment to be complete while patient is alone) Abuse/Neglect Assessment Can Be Completed: Yes Physical Abuse: Denies Verbal Abuse: Denies Sexual Abuse: Denies Exploitation of patient/patient's resources: Denies Self-Neglect:  Denies     Merchant navy officerAdvance Directives (For Healthcare) Does Patient Have a Medical Advance Directive?: No Would patient like information on creating a medical advance directive?: No - Patient declined    Additional Information 1:1 In Past 12 Months?: No CIRT Risk: No Elopement Risk: No Does patient have medical clearance?: Yes     Disposition:  Disposition Initial Assessment Completed for this Encounter: Yes Disposition of Patient: Admit Type of inpatient treatment program: Adult Patient refused recommended treatment: No Mode of transportation if patient is discharged?: N/A Patient referred to: Other (Comment)(To be reviewed by PA)  On Site Evaluation by:   Reviewed with Physician:    Alexandria LodgeHarvey, Naif Alabi Ray 08/02/2017 12:34 AM

## 2017-11-25 ENCOUNTER — Emergency Department (HOSPITAL_COMMUNITY)
Admission: EM | Admit: 2017-11-25 | Discharge: 2017-11-25 | Disposition: A | Discharge status: Home / Self Care | Payer: No Typology Code available for payment source | Attending: Emergency Medicine | Admitting: Emergency Medicine

## 2017-11-25 DIAGNOSIS — F17210 Nicotine dependence, cigarettes, uncomplicated: Secondary | ICD-10-CM | POA: Insufficient documentation

## 2017-11-25 DIAGNOSIS — M5489 Other dorsalgia: Secondary | ICD-10-CM | POA: Diagnosis present

## 2017-11-25 DIAGNOSIS — M62830 Muscle spasm of back: Secondary | ICD-10-CM | POA: Diagnosis not present

## 2017-11-25 MED ORDER — CYCLOBENZAPRINE HCL 10 MG PO TABS: 10.0000 mg | ORAL_TABLET | Freq: Two times a day (BID) | ORAL | 0 refills | Status: AC | PRN

## 2017-11-25 MED ORDER — DICLOFENAC SODIUM 50 MG PO TBEC: 50.0000 mg | DELAYED_RELEASE_TABLET | Freq: Two times a day (BID) | ORAL | 0 refills | Status: AC

## 2017-11-25 NOTE — Discharge Instructions (Signed)
Do not drive while taking the muscle relaxer as it will make you sleepy. Follow up with your company doctor. Return here as needed.

## 2017-11-25 NOTE — ED Provider Notes (Signed)
[facility]-EMERGENCY DEPT Provider Note   CSN: [id number] Arrival date & time: 11/25/17  1233     History   Chief Complaint Chief Complaint  Patient presents with  . Back Pain    HPI Jorge Young is a 37 y.o. male who presents to the ED with back pain. The pain started 2 day ago. Patient works for [employer] and states he was lifting when the pain started. He describes the pain as stabbing and throbbing. The pain is located on the right side of the back. Patient denies loss of control of bladder or bowels. Patient took tylenol last night for pain without relief. Patient had to leave work due to the pain.   The history is provided by the patient. History limited by: patient is deaf. A language interpreter was used.  Back Pain   The current episode started 2 days ago. The problem occurs constantly. The problem has been gradually worsening. The pain is associated with lifting heavy objects. The pain is present in the thoracic spine. The pain is moderate. The symptoms are aggravated by bending and twisting. The pain is the same all the time. Pertinent negatives include no abdominal swelling and no bowel incontinence.    Past Medical History:  Diagnosis Date  . Deaf   . Depression     Patient Active Problem List   Diagnosis Date Noted  . 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 APPENDECTOMY          Home Medications    Prior to Admission medications   Medication Sig Start Date End Date Taking? Authorizing Provider  cyclobenzaprine (FLEXERIL) 10 MG tablet Take 1 tablet (10 mg total) by mouth 2 (two) times daily as needed for muscle spasms. 11/25/17   Janne Napoleon, NP  diclofenac (VOLTAREN) 50 MG EC tablet Take 1 tablet (50 mg total) by mouth 2 (two) times daily. 11/25/17   Janne Napoleon, NP  sertraline (ZOLOFT) 50 MG tablet Take 1 tablet by mouth daily. 07/22/17   [provider]    Family History No family history on file.  Social History Social History   Tobacco Use  . Smoking status: Current Every Day Smoker    Packs/day: 1.00    Types: Cigarettes  . Smokeless tobacco: Never Used  Substance Use Topics  . Alcohol use: Yes    Comment: Daily. 3 16oz beer a day. Last drink: 17:00   . Drug use: Yes    Types: Marijuana    Comment: Hydrocodone from streets, last used Thursday night. Last used Marjuana 14:00 today.      Allergies   Patient has no known allergies.   Review of Systems Review of Systems  Gastrointestinal: Negative for bowel incontinence.  Musculoskeletal: Positive for back pain.  All other systems reviewed and are negative.    Physical Exam Updated Vital Signs BP 119/72   Pulse 88   Temp 98.7 F (37.1 C) (Oral)   Resp 16   SpO2 99%   Physical Exam  Constitutional: He appears well-developed and well-nourished. No distress.  HENT:  Head: Normocephalic and atraumatic.  Eyes: EOM are normal.  Neck: Neck supple.  Cardiovascular: Normal rate and regular rhythm.  Pulmonary/Chest: Effort normal and breath sounds normal.  Abdominal: Soft. There is no tenderness.  Musculoskeletal: Normal range of motion.       Thoracic back: He exhibits tenderness and spasm. He  exhibits normal range of motion, no deformity and normal pulse.       Back:  Grips are equal, radial pulses 2+.  Neurological: He is alert. He has normal strength. No sensory deficit. Gait normal.  Reflex Scores:      Bicep reflexes are 2+ on the right side and 2+ on the left side.      Brachioradialis reflexes are 2+ on the right side and 2+ on the left side.      Patellar reflexes are 2+ on the right side and 2+ on the left side. Skin: Skin is warm and dry.  Psychiatric: He has a normal mood and affect.  Nursing note and vitals reviewed.    ED Treatments / Results  Labs (all labs ordered are listed, but only abnormal results are displayed) Labs  Reviewed - No data to display  Radiology No results found.  Procedures Procedures (including critical care time)  Medications Ordered in ED Medications - No data to display   Initial Impression / Assessment and Plan / ED Course  I have reviewed the triage vital signs and the nursing notes. 37 y.o. male here with right thoracic pain and muscle spasm after lifting heavy boxes at work. Stable for d/c without neuro deficits. Patient can walk but states is painful.  No loss of bowel or bladder control.  No concern for cauda equina.  No fever, night sweats, weight loss, h/o cancer, IVDU.  RICE protocol and pain medicine indicated and discussed with patient.   Final Clinical Impressions(s) / ED Diagnoses   Final diagnoses:  Spasm of thoracic back muscle    ED Discharge Orders        Ordered    diclofenac (VOLTAREN) 50 MG EC tablet  2 times daily     11/25/17 1328    cyclobenzaprine (FLEXERIL) 10 MG tablet  2 times daily PRN     11/25/17 1328       Damian Leavelleese, [place] M, [place] 11/25/17 1337    Linwood DibblesKnapp, Jon, MD 11/26/17 1623

## 2017-11-25 NOTE — ED Triage Notes (Signed)
Back pain that started 2 days ago, patient works at [employer] and states that the pain started after he lifted an object. Pain 8/10. +stabbing/throbbing pain, intermittent numbness/tingling to both legs. Patient has interpreter at bedside completing sign language.

## 2018-06-27 IMAGING — CT CT HEAD W/O CM
3 series · 15 of 47 positions shown, 18 images · non-contrast
Comparison: CT of the head performed 02/25/2004

CLINICAL DATA: Status post head injury, with vomiting. Initial
encounter.

EXAM:
CT HEAD WITHOUT CONTRAST
TECHNIQUE: Contiguous axial images were obtained from the base of the skull
through the vertex without intravenous contrast.

[Series 3: head 5.0 h30s · axial · 0.44mm/px · z∈[-102,+43]mm · 9 of 35 slices shown, 12 images]
[im 3/35  brain]
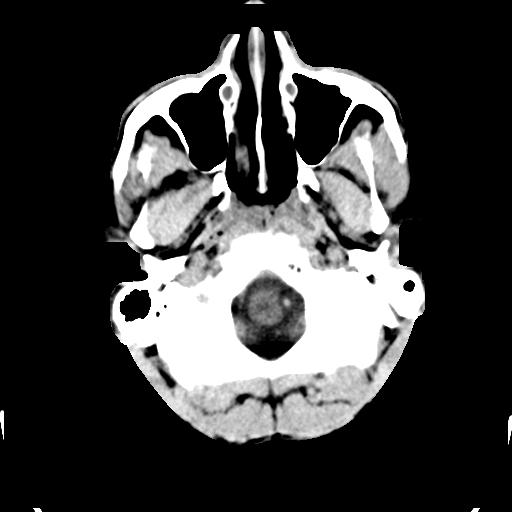
[im 3/35  bone]
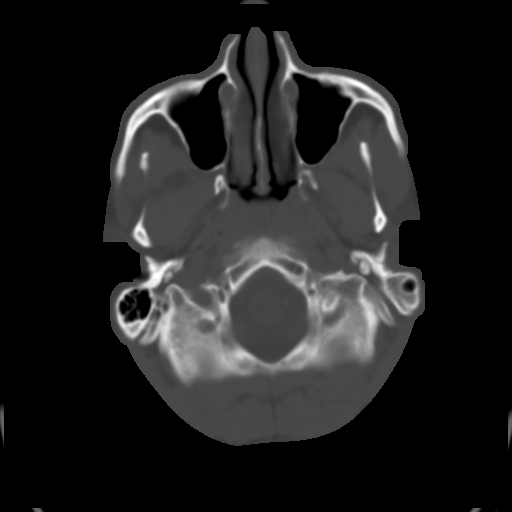
[im 6/35  brain]
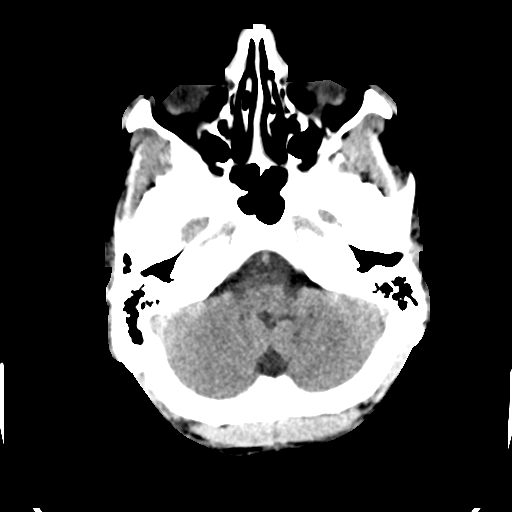
[im 10/35  brain]
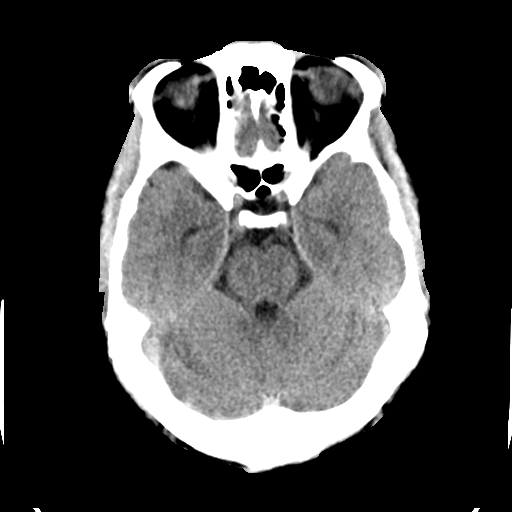
[im 13/35  brain]
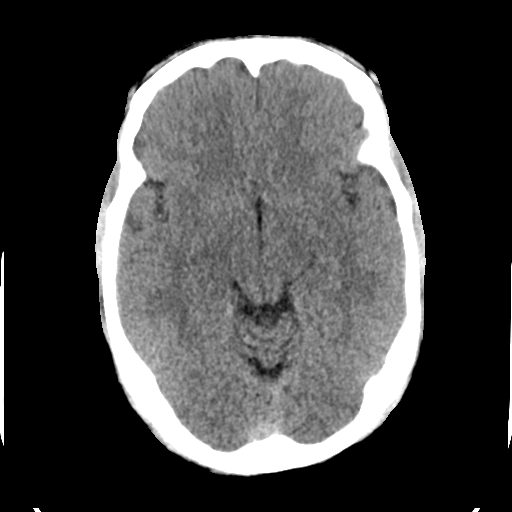
[im 18/35  brain]
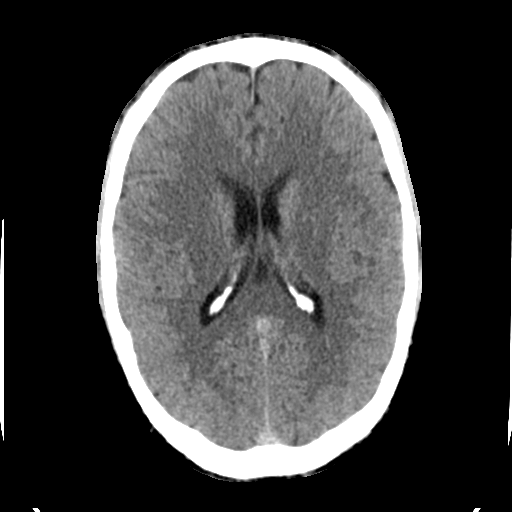
[im 18/35  bone]
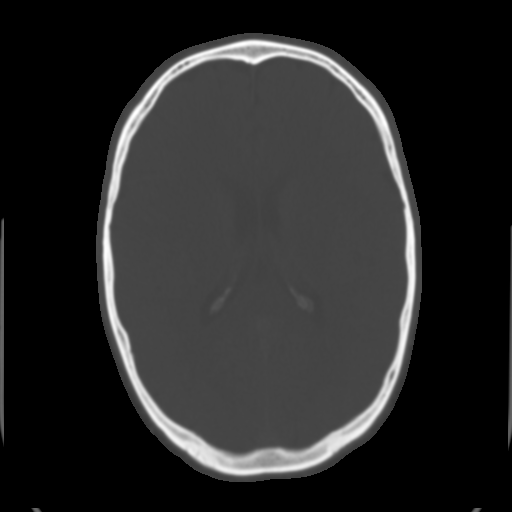
[im 22/35  brain]
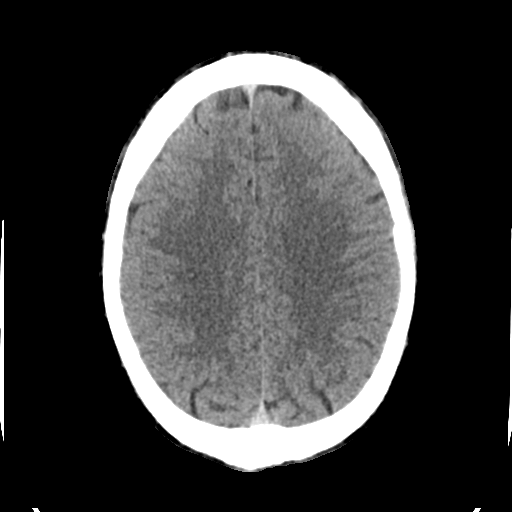
[im 25/35  brain]
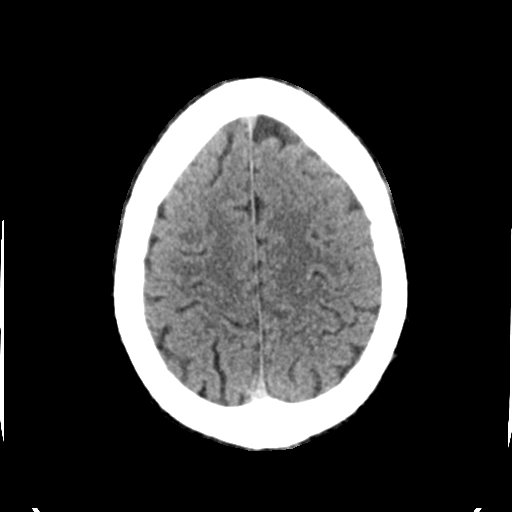
[im 29/35  brain]
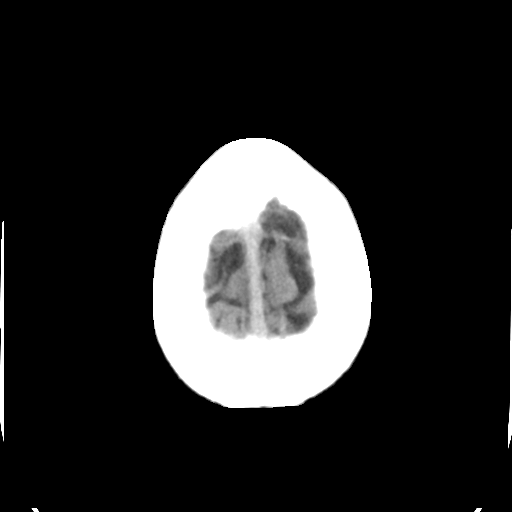
[im 32/35  brain]
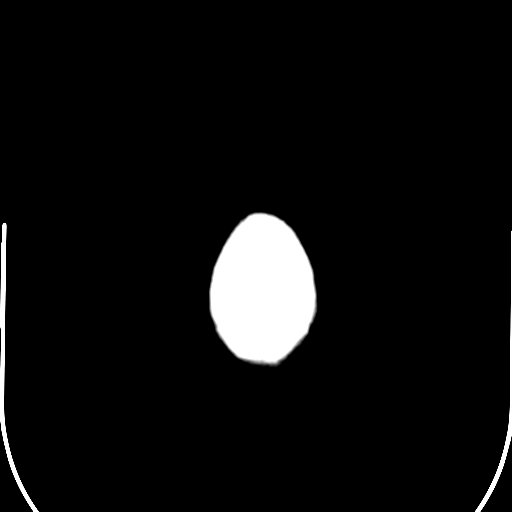
[im 32/35  bone]
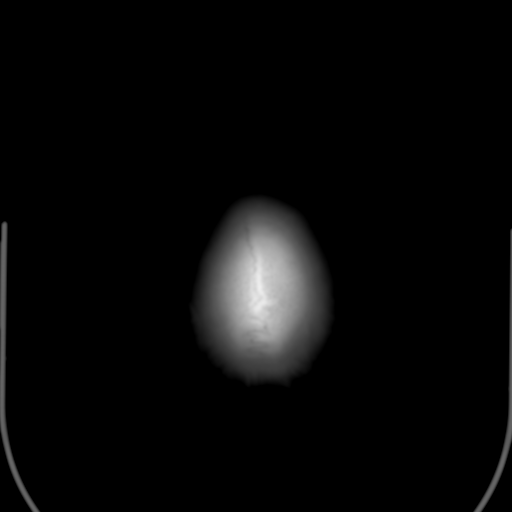

[Series 5: head 3.0 mpr cor · coronal · 0.33mm/px · 3 of 71 slices shown]
[im 24/71  brain]
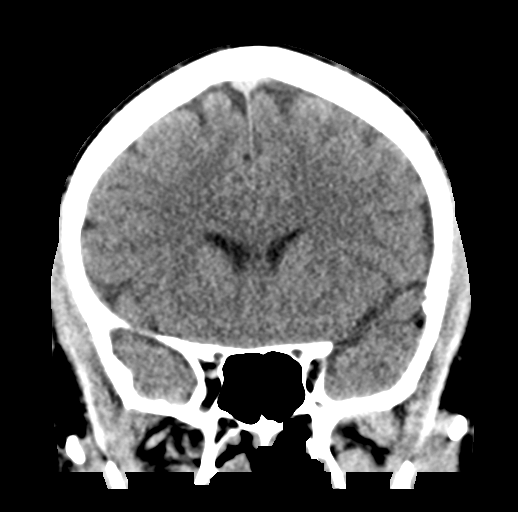
[im 32/71  brain]
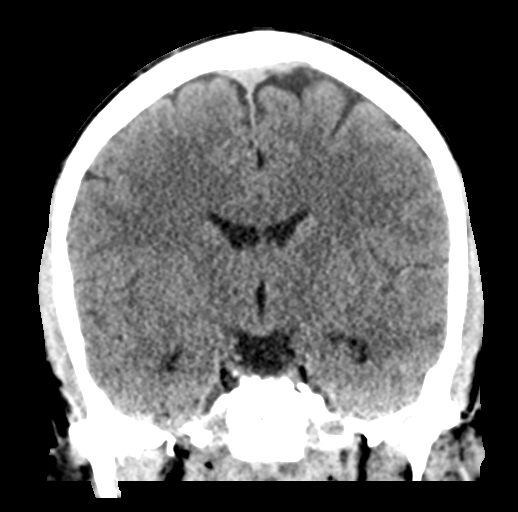
[im 39/71  brain]
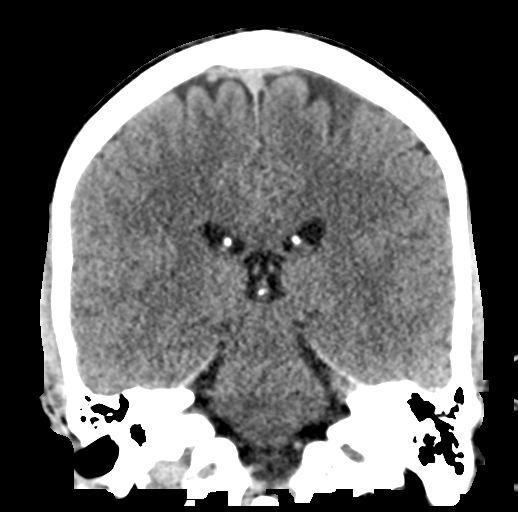

[Series 6: head 3.0 mpr sag · sagittal · 0.35mm/px · 3 of 67 slices shown]
[im 23/67  brain]
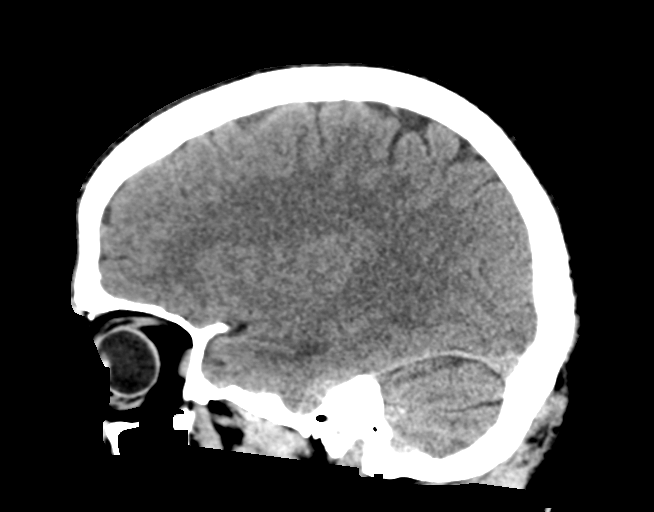
[im 34/67  brain]
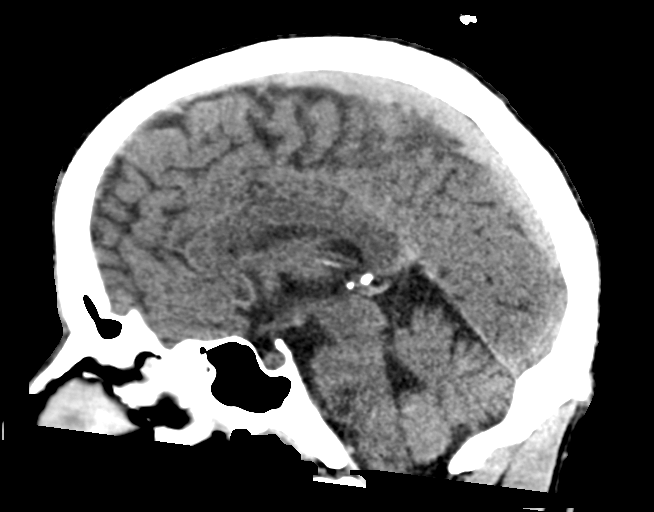
[im 45/67  brain]
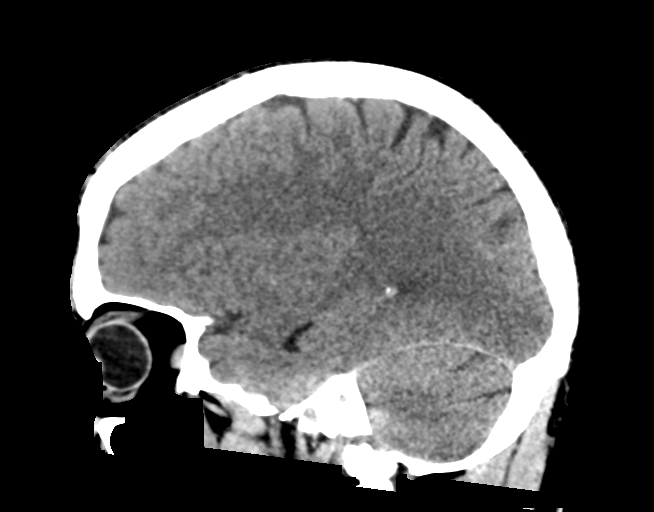

[15 of 47 positions shown; findings below may reference images not displayed]

FINDINGS: Brain: No evidence of acute infarction, hemorrhage, hydrocephalus,
extra-axial collection or mass lesion/mass effect.

The posterior fossa, including the cerebellum, brainstem and fourth
ventricle, is within normal limits. The third and lateral
ventricles, and basal ganglia are unremarkable in appearance. The
cerebral hemispheres are symmetric in appearance, with normal
gray-white differentiation. No mass effect or midline shift is seen.

Vascular: No hyperdense vessel or unexpected calcification.

Skull: There is no evidence of fracture; visualized osseous
structures are unremarkable in appearance.

Sinuses/Orbits: The orbits are within normal limits. The paranasal
sinuses and mastoid air cells are well-aerated.

Other: No significant soft tissue abnormalities are seen.
IMPRESSION: No evidence of traumatic intracranial injury or fracture.

## 2018-11-04 IMAGING — CR DG TIBIA/FIBULA 2V*R*
2 series · 2 of 2 positions shown · non-contrast
Comparison: None.

CLINICAL DATA: Abscess anterior mid calf for 2 weeks.

EXAM:
RIGHT TIBIA AND FIBULA - 2 VIEW

[x tib-fib ap right]
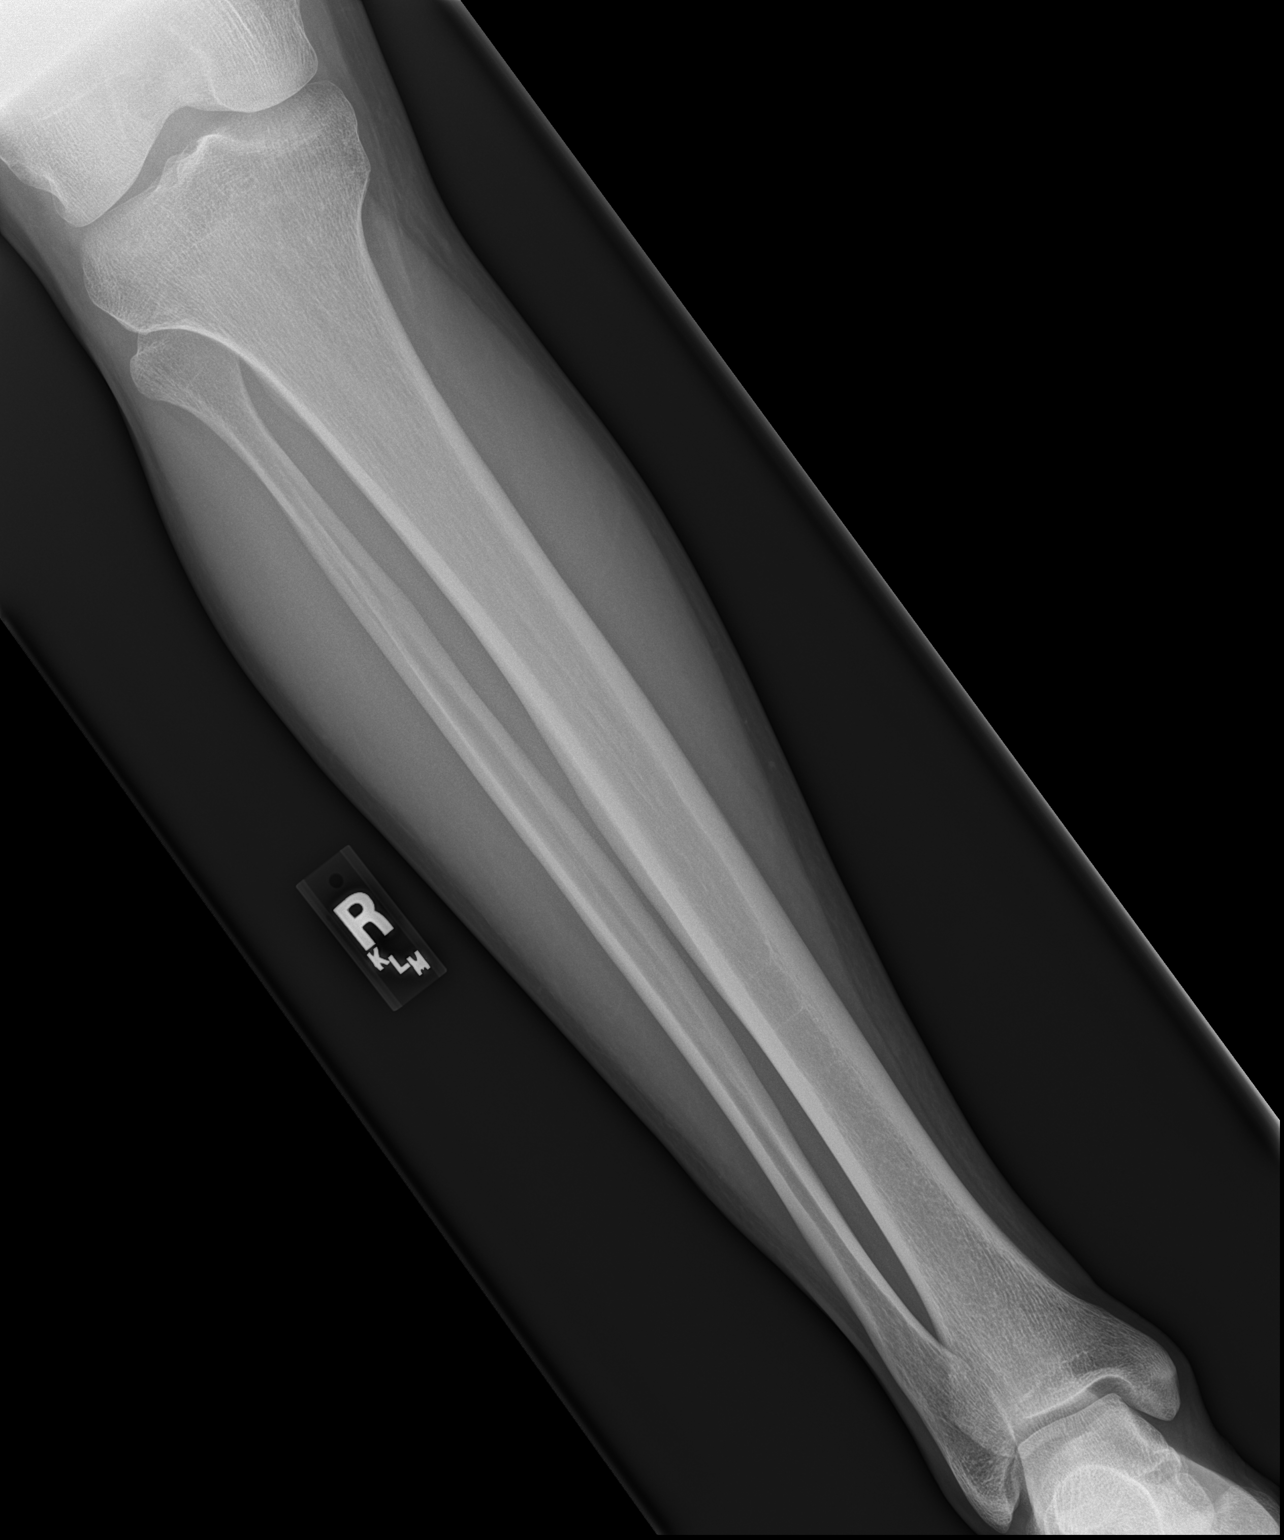

[x tib-fib lat right]
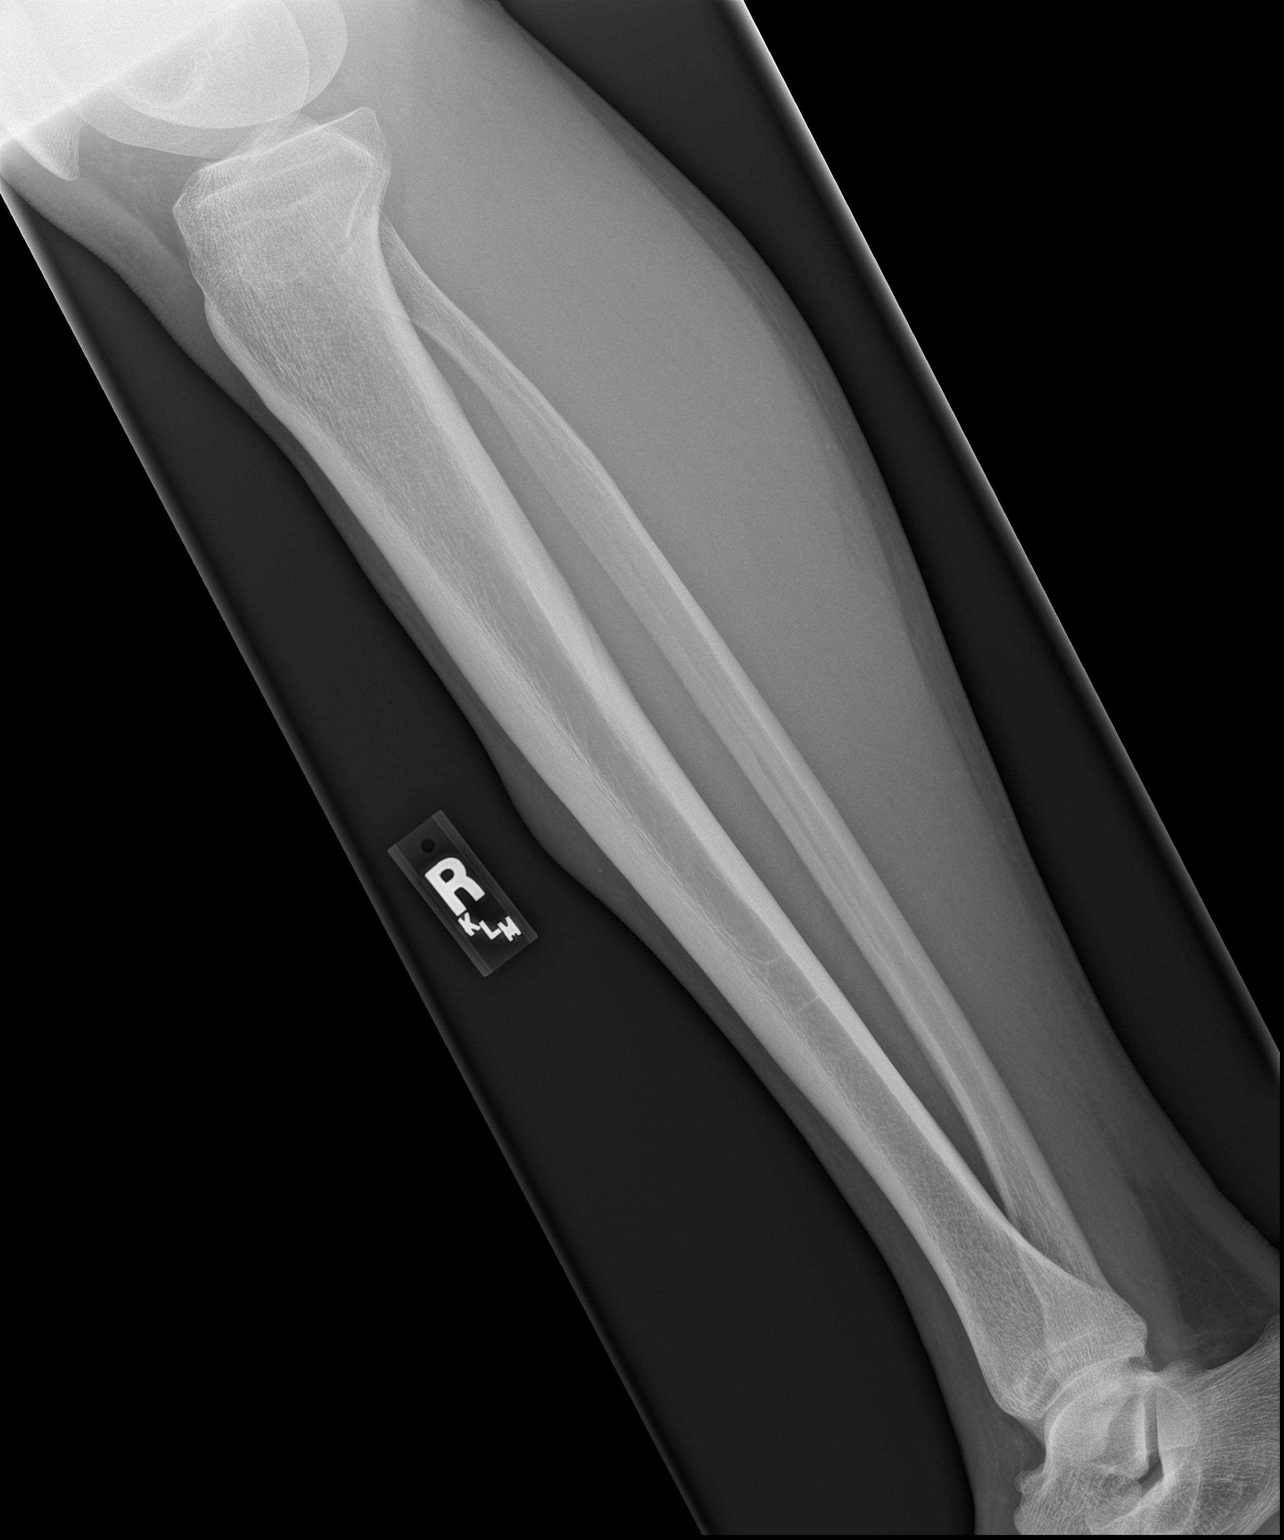

[2 of 2 positions shown; findings below may reference images not displayed]

FINDINGS: Focal soft tissue swelling over the anterior tibial midshaft. No
underlying bony abnormality. No fracture, subluxation, dislocation
or changes of acute osteomyelitis. No radiopaque foreign bodies.
IMPRESSION: No acute bony abnormality.

## 2018-12-14 ENCOUNTER — Encounter (HOSPITAL_COMMUNITY): Payer: Self-pay

## 2018-12-14 ENCOUNTER — Ambulatory Visit (HOSPITAL_COMMUNITY)
Admission: EM | Admit: 2018-12-14 | Discharge: 2018-12-14 | Disposition: A | Discharge status: Home / Self Care | Payer: Managed Care, Other (non HMO) | Attending: Internal Medicine | Admitting: Internal Medicine

## 2018-12-14 ENCOUNTER — Other Ambulatory Visit: Payer: Self-pay

## 2018-12-14 DIAGNOSIS — Z20822 Contact with and (suspected) exposure to covid-19: Secondary | ICD-10-CM

## 2018-12-14 DIAGNOSIS — Z20828 Contact with and (suspected) exposure to other viral communicable diseases: Secondary | ICD-10-CM | POA: Insufficient documentation

## 2018-12-14 NOTE — ED Triage Notes (Signed)
Pt presents to have Covid testing after exposure from a family member that tested positive.

## 2018-12-14 NOTE — Discharge Instructions (Signed)
Will notify of any positive findings. You may monitor your results on your MyChart online as well.   I recommend isolation until results are back.  Clean common areas, door knobs etc with a lysol type product.  Try to avoid close contact with the infected person.  Please be seen again or go to the Er for any development of shortness of breath, or difficulty breathing.

## 2018-12-14 NOTE — ED Provider Notes (Signed)
[facility]    CSN: [id number] Arrival date & time: 12/14/18  1824     History   Chief Complaint Chief Complaint  Patient presents with  . COVID EXPOSURE    HPI Jorge Young is a 38 y.o. male.   Jorge Young presents with complaints of exposure to Covid-19. Roommate tested positive today. Patient is feeling well, without symptoms. No cough, no congestion. No loss of taste or smell; no gi symptoms; no sore throat; no fevers, chills, headache or body aches. Works at [employer] currently.    ASL video interpreter used to collect history and physical exam.    ROS per HPI, negative if not otherwise mentioned.      Past Medical History:  Diagnosis Date  . Deaf   . Depression     Patient Active Problem List   Diagnosis Date Noted  . Alcohol-induced mood disorder with depressive symptoms ([place]) 08/02/2017    Past Surgical History:  Procedure Laterality Date  . APPENDECTOMY         Home Medications    Prior to Admission medications   Medication Sig Start Date End Date Taking? Authorizing Provider  cyclobenzaprine (FLEXERIL) 10 MG tablet Take 1 tablet (10 mg total) by mouth 2 (two) times daily as needed for muscle spasms. 11/25/17   Ashley Murrain, NP  diclofenac (VOLTAREN) 50 MG EC tablet Take 1 tablet (50 mg total) by mouth 2 (two) times daily. 11/25/17   Ashley Murrain, NP  sertraline (ZOLOFT) 50 MG tablet Take 1 tablet by mouth daily. 07/22/17   [provider]    Family History Family History  Family history unknown: Yes    Social History Social History   Tobacco Use  . Smoking status: Current Every Day Smoker    Packs/day: 1.00    Types: Cigarettes  . Smokeless tobacco: Never Used  Substance Use Topics  . Alcohol use: Yes    Comment: Daily. 3 16oz beer a day. Last drink: 17:00   . Drug use: Yes    Types: Marijuana    Comment: Hydrocodone from streets, last used Thursday night. Last used Marjuana 14:00 today.      Allergies    Patient has no known allergies.   Review of Systems Review of Systems   Physical Exam Triage Vital Signs ED Triage Vitals [12/14/18 1927]  Enc Vitals Group     BP 118/71     Pulse Rate 74     Resp 18     Temp 98.3 F (36.8 C)     Temp Source Oral     SpO2 97 %     Weight      Height      Head Circumference      Peak Flow      Pain Score 0     Pain Loc      Pain Edu?      Excl. in [place]?    No data found.  Updated Vital Signs BP 118/71 (BP Location: Left Arm)   Pulse 74   Temp 98.3 F (36.8 C) (Oral)   Resp 18   SpO2 97%    Physical Exam Constitutional:      Appearance: He is well-developed.  Cardiovascular:     Rate and Rhythm: Normal rate.  Pulmonary:     Effort: Pulmonary effort is normal.  Skin:    General: Skin is warm and dry.  Neurological:     Mental Status: He is alert  and oriented to person, place, and time.      UC Treatments / Results  Labs (all labs ordered are listed, but only abnormal results are displayed) Labs Reviewed  NOVEL CORONAVIRUS, NAA (HOSPITAL ORDER, SEND-OUT TO REF LAB)    EKG   Radiology No results found.  Procedures Procedures (including critical care time)  Medications Ordered in UC Medications - No data to display  Initial Impression / Assessment and Plan / UC Course  I have reviewed the triage vital signs and the nursing notes.  Pertinent labs & imaging results that were available during my care of the patient were reviewed by me and considered in my medical decision making (see chart for details).     No symptoms. covid exposure. Encouraged isolation until results back as works for [employer]. Will notify of any positive findings and if any changes to treatment are needed.  Return precautions provided. Patient verbalized understanding and agreeable to plan.   Final Clinical Impressions(s) / UC Diagnoses   Final diagnoses:  Exposure to Covid-19 Virus     Discharge Instructions     Will notify of any  positive findings. You may monitor your results on your MyChart online as well.   I recommend isolation until results are back.  Clean common areas, door knobs etc with a lysol type product.  Try to avoid close contact with the infected person.  Please be seen again or go to the Er for any development of shortness of breath, or difficulty breathing.    ED Prescriptions    None     Controlled Substance Prescriptions [place] Controlled Substance Registry consulted? Not Applicable   Georgetta HaberBurky, Natalie B, NP 12/14/18 2001

## 2018-12-17 LAB — NOVEL CORONAVIRUS, NAA (HOSP ORDER, SEND-OUT TO REF LAB; TAT 18-24 HRS): SARS-CoV-2, NAA: NOT DETECTED

## 2018-12-18 ENCOUNTER — Encounter (HOSPITAL_COMMUNITY): Payer: Self-pay

## 2019-01-03 ENCOUNTER — Other Ambulatory Visit: Payer: Self-pay

## 2019-01-03 ENCOUNTER — Ambulatory Visit (HOSPITAL_COMMUNITY)
Admission: EM | Admit: 2019-01-03 | Discharge: 2019-01-03 | Disposition: A | Discharge status: Home / Self Care | Payer: Managed Care, Other (non HMO) | Attending: Emergency Medicine | Admitting: Emergency Medicine

## 2019-01-03 ENCOUNTER — Encounter (HOSPITAL_COMMUNITY): Payer: Self-pay | Admitting: Emergency Medicine

## 2019-01-03 DIAGNOSIS — Z1159 Encounter for screening for other viral diseases: Secondary | ICD-10-CM | POA: Diagnosis not present

## 2019-01-03 DIAGNOSIS — Z0289 Encounter for other administrative examinations: Secondary | ICD-10-CM

## 2019-01-03 DIAGNOSIS — Z7689 Persons encountering health services in other specified circumstances: Secondary | ICD-10-CM

## 2019-01-03 DIAGNOSIS — Z20828 Contact with and (suspected) exposure to other viral communicable diseases: Secondary | ICD-10-CM | POA: Insufficient documentation

## 2019-01-03 NOTE — Discharge Instructions (Signed)
Covid negative greater than 2 weeks ok, ok to return to work.  Will provide with second test for validation.

## 2019-01-03 NOTE — ED Triage Notes (Signed)
Pt sts had exposure in home and his work requesting second negative test before he can return; pt denies sx; pt is hearing impaired

## 2019-01-03 NOTE — ED Provider Notes (Signed)
[place]    CSN: [id number] Arrival date & time: 01/03/19  1148      History   Chief Complaint Chief Complaint  Patient presents with  . covid  test  . Letter for School/Work    HPI Jorge Young is a 38 y.o. male.   Jorge Young presents with requests for a second covid-19 test. His roommate tested positive, therefore Jorge Young initially sought testing 7/16. This was negative. He has never had any symptoms, no fever, no gi complaints. His workplace is requiring a second test for him to return. His roommate is feeling well. No other known ill contacts. Without contributing medical history.    ASL video interpreter used to collect history and physical exam.    ROS per HPI, negative if not otherwise mentioned.      Past Medical History:  Diagnosis Date  . Deaf   . Depression     Patient Active Problem List   Diagnosis Date Noted  . 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 APPENDECTOMY         Home Medications    Prior to Admission medications   Medication Sig Start Date End Date Taking? Authorizing Provider  cyclobenzaprine (FLEXERIL) 10 MG tablet Take 1 tablet (10 mg total) by mouth 2 (two) times daily as needed for muscle spasms. 11/25/17   Ashley Murrain, NP  diclofenac (VOLTAREN) 50 MG EC tablet Take 1 tablet (50 mg total) by mouth 2 (two) times daily. 11/25/17   Ashley Murrain, NP  sertraline (ZOLOFT) 50 MG tablet Take 1 tablet by mouth daily. 07/22/17   [provider]    Family History Family History  Family history unknown: Yes    Social History Social History   Tobacco Use  . Smoking status: Current Every Day Smoker    Packs/day: 1.00    Types: Cigarettes  . Smokeless tobacco: Never Used  Substance Use Topics  . Alcohol use: Yes    Comment: Daily. 3 16oz beer a day. Last drink: 17:00   . Drug use: Yes    Types: Marijuana    Comment:  Hydrocodone from streets, last used Thursday night. Last used Marjuana 14:00 today.      Allergies   Patient has no known allergies.   Review of Systems Review of Systems   Physical Exam Triage Vital Signs ED Triage Vitals [01/03/19 1219]  Enc Vitals Group     BP 114/72     Pulse Rate 82     Resp 18     Temp 98.3 F (36.8 C)     Temp Source Temporal     SpO2 99 %     Weight      Height      Head Circumference      Peak Flow      Pain Score 0     Pain Loc      Pain Edu?      Excl. in [place]?    No data found.  Updated Vital Signs BP 114/72 (BP Location: Right Arm)   Pulse 82   Temp 98.3 F (36.8 C) (Temporal)   Resp 18   SpO2 99%   Visual Acuity Right Eye Distance:   Left Eye Distance:   Bilateral Distance:    Right Eye Near:   Left Eye Near:    Bilateral Near:     Physical Exam Constitutional:  Appearance: He is well-developed.  Cardiovascular:     Rate and Rhythm: Normal rate.  Pulmonary:     Effort: Pulmonary effort is normal.  Skin:    General: Skin is warm and dry.  Neurological:     Mental Status: He is alert and oriented to person, place, and time.      UC Treatments / Results  Labs (all labs ordered are listed, but only abnormal results are displayed) Labs Reviewed  NOVEL CORONAVIRUS, NAA (HOSPITAL ORDER, SEND-OUT TO REF LAB)    EKG   Radiology No results found.  Procedures Procedures (including critical care time)  Medications Ordered in UC Medications - No data to display  Initial Impression / Assessment and Plan / UC Course  I have reviewed the triage vital signs and the nursing notes.  Pertinent labs & imaging results that were available during my care of the patient were reviewed by me and considered in my medical decision making (see chart for details).     Repeat covid testing pending. Will notify of any positive findings and if any changes to treatment are needed.  Patient verbalized understanding and agreeable  to plan.   Final Clinical Impressions(s) / UC Diagnoses   Final diagnoses:  Return to work evaluation     Discharge Instructions     Covid negative greater than 2 weeks ok, ok to return to work.  Will provide with second test for validation.    ED Prescriptions    None     Controlled Substance Prescriptions  Controlled Substance Registry consulted? Not Applicable   Georgetta HaberBurky, Avanell Banwart B, NP 01/03/19 1243

## 2019-01-04 ENCOUNTER — Encounter (HOSPITAL_COMMUNITY): Payer: Self-pay

## 2019-01-04 LAB — NOVEL CORONAVIRUS, NAA (HOSP ORDER, SEND-OUT TO REF LAB; TAT 18-24 HRS): SARS-CoV-2, NAA: NOT DETECTED

## 2019-03-21 IMAGING — US US EXTREM LOW*R* LIMITED
1 series · 13 of 19 positions shown · non-contrast
Comparison: PRIOR RADIOGRAPH FROM 01/28/2017

CLINICAL DATA: Initial evaluation of 2 palpable areas, 1 at right
anterior mid leg (3 months), other at right anterior foot (1 year)

EXAM:
ULTRASOUND RIGHT THE LOWER EXTREMITY LIMITED
TECHNIQUE: Ultrasound examination of the lower extremity soft tissues was
performed in the area of clinical concern.

[Series 1: us extrem low*right* limited · 0.04mm/px · 19 acquisitions, 13 frames shown]
[im 1/19]
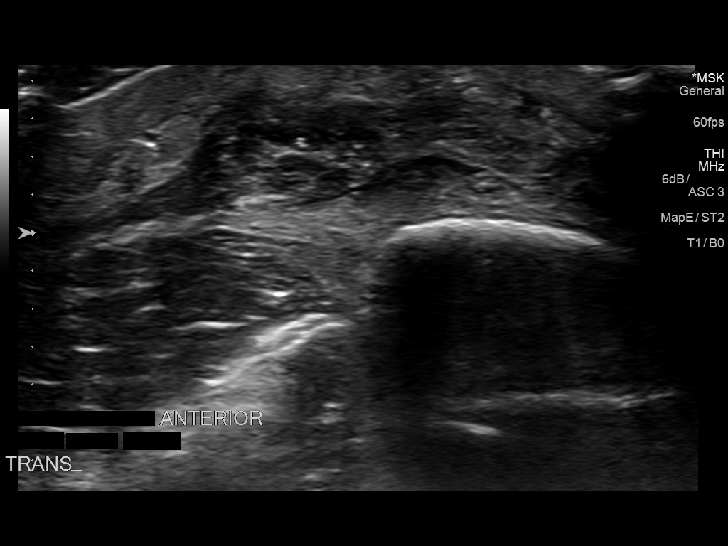
[im 3/19]
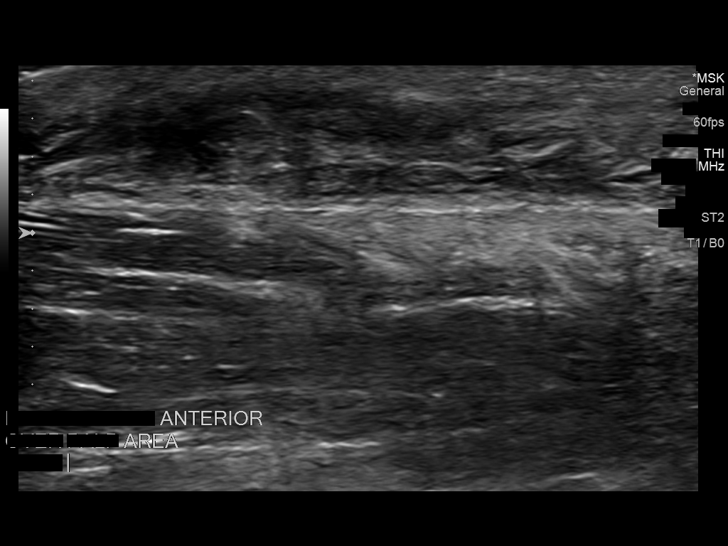
[im 4/19]
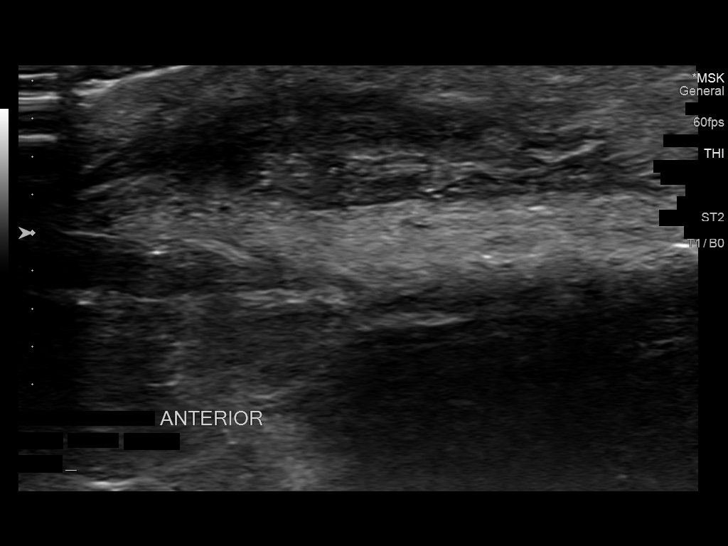
[im 6/19]
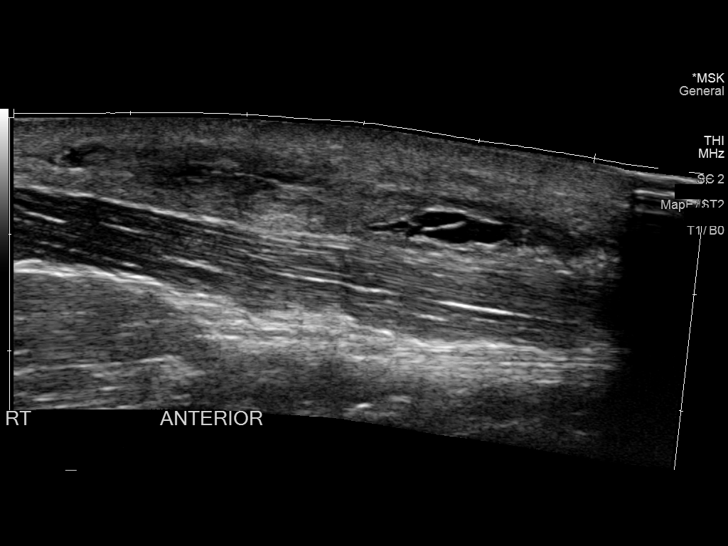
[im 7/19]
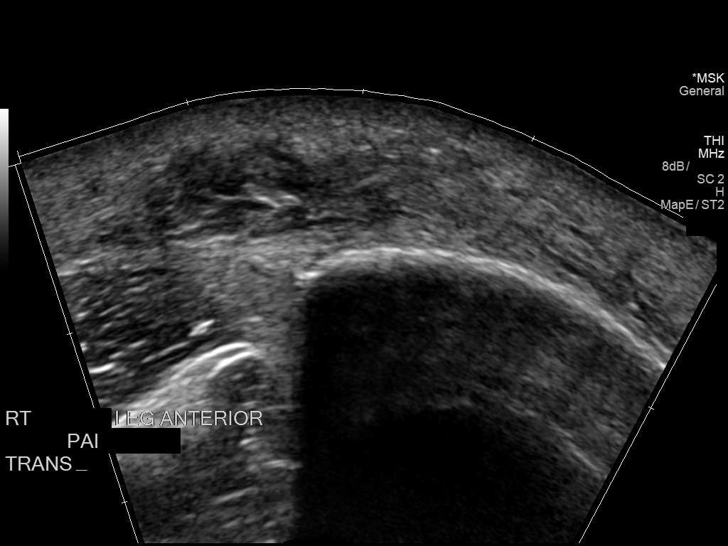
[im 9/19]
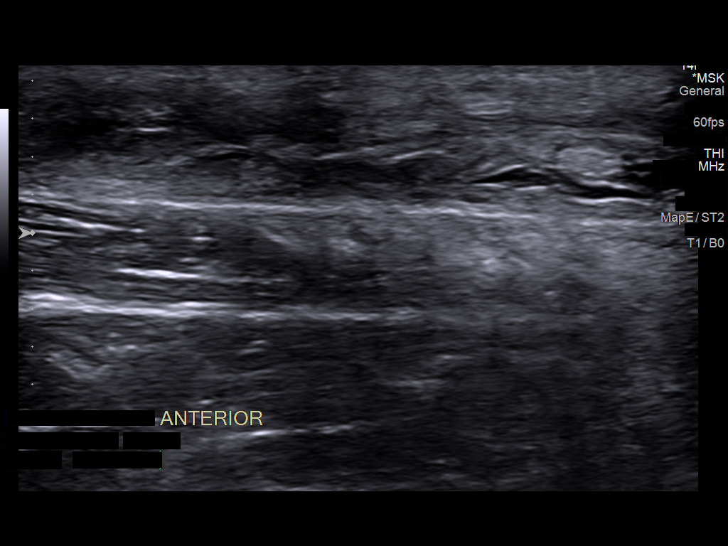
[im 10/19]
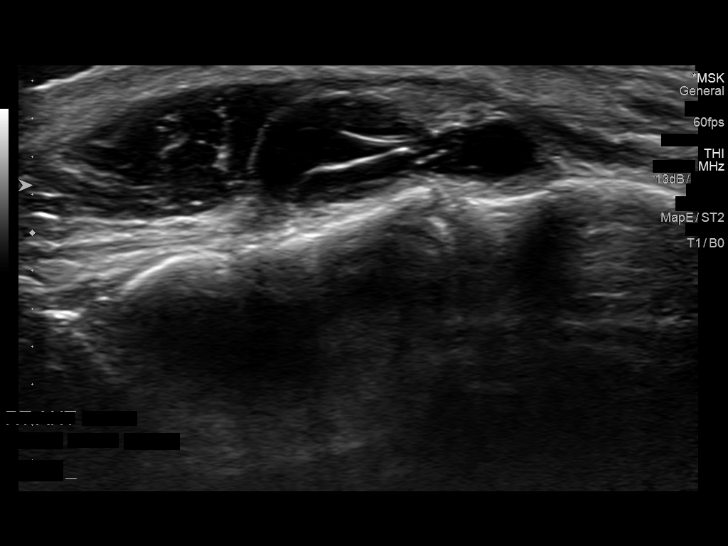
[im 11/19]
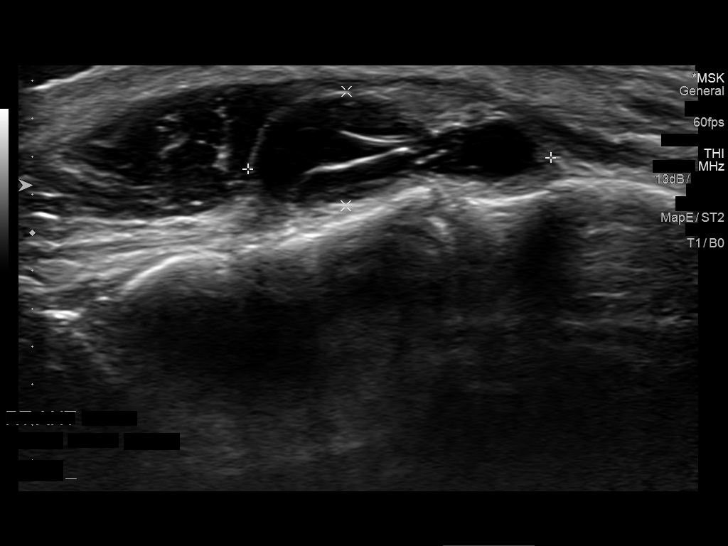
[im 13/19]
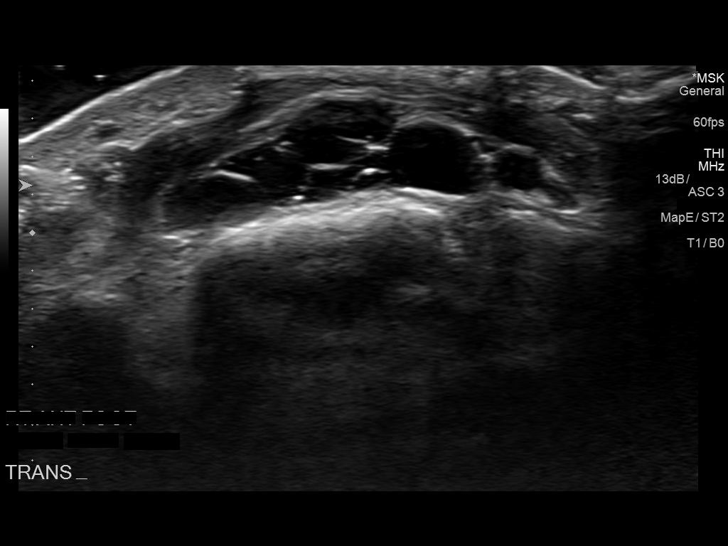
[im 14/19]
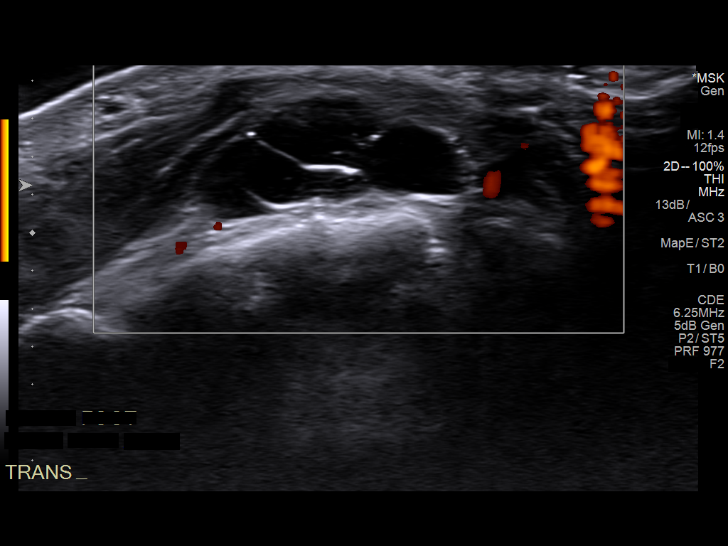
[im 16/19]
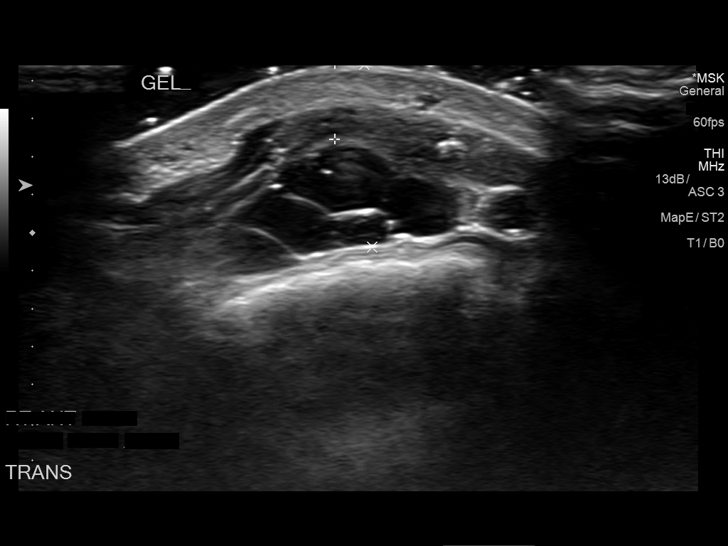
[im 17/19]
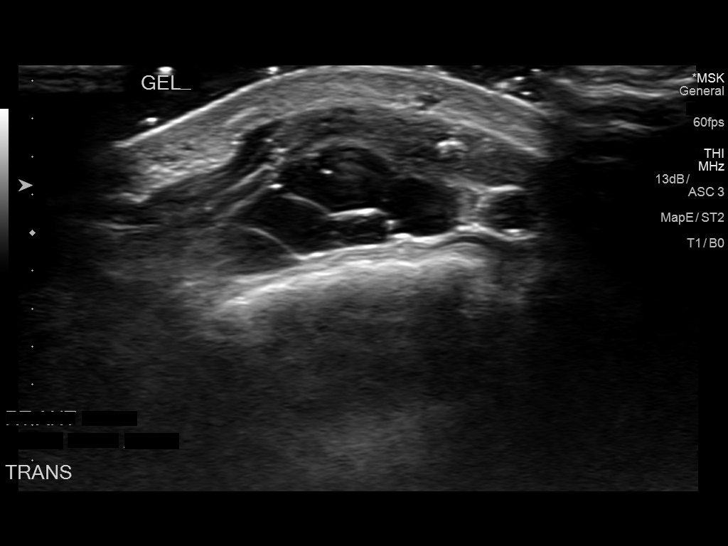
[im 19/19]
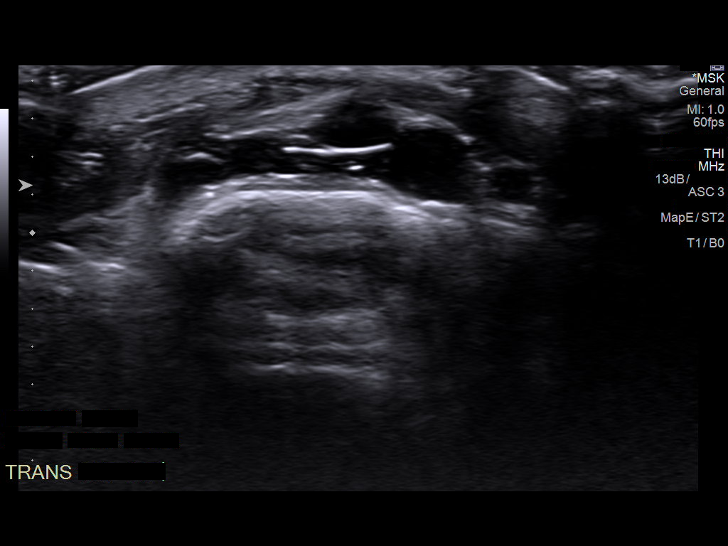

[13 of 19 positions shown; findings below may reference images not displayed]

FINDINGS: Targeted ultrasound of the palpable area of concern at the anterior
mid right leg demonstrates a somewhat ill-defined complex and
heterogeneous area/lesion. This is predominantly hypoechoic in
appearance, perhaps representing internal fat. Margins are somewhat
poorly defined, making exact measurements of this lesion difficult.
Scattered areas of internal vascularity. Lesion is indeterminate.

Targeted ultrasound of the palpable area concern at the right foot
demonstrates a a complex anechoic cystic lesion measuring 1.6 x
x 1.8 cm. This is positioned approximately 0.5-1.0 cm deep to the
overlying skin. No internal vascularity. Lesion is indeterminate.
IMPRESSION: 1. Complex heterogeneous, and somewhat ill-defined area/lesion
within the anterior mid right leg, not well delineated by
ultrasound. Further evaluation with dedicated MRI, with and without
contrast, is recommended for further characterization.
2. 1.6 x 0.6 x 1.8 cm complex cystic lesion at the dorsal right
foot. Finding is indeterminate, but favored to reflect a complex
ganglion cyst. This could also be further assessed with follow-up
MRI.

## 2019-04-18 ENCOUNTER — Encounter (HOSPITAL_COMMUNITY): Payer: Self-pay | Admitting: Emergency Medicine

## 2019-04-18 ENCOUNTER — Other Ambulatory Visit: Payer: Self-pay

## 2019-04-18 ENCOUNTER — Ambulatory Visit (HOSPITAL_COMMUNITY)
Admission: EM | Admit: 2019-04-18 | Discharge: 2019-04-18 | Disposition: A | Discharge status: Home / Self Care | Payer: Managed Care, Other (non HMO) | Attending: Emergency Medicine | Admitting: Emergency Medicine

## 2019-04-18 DIAGNOSIS — Z20822 Contact with and (suspected) exposure to covid-19: Secondary | ICD-10-CM

## 2019-04-18 DIAGNOSIS — Z20828 Contact with and (suspected) exposure to other viral communicable diseases: Secondary | ICD-10-CM

## 2019-04-18 NOTE — ED Triage Notes (Signed)
Pt is hearing impaired.  [employer] Interpreter usedOllen Gross [phone]  Pt's roommates g/f has tested positive for Covid.  She was in the apartment about 3/4 days ago but he was not actively around her.  They all want to be tested to be sure they did not get it. He is having no symptoms.

## 2019-04-18 NOTE — ED Provider Notes (Signed)
HPI  SUBJECTIVE:  Jorge Young is a 38 y.o. male who presents for Covid testing.  He states that he was around his roommate's girlfriend 3 or 4 days ago who recently tested positive.  States that he walked past her repeatedly, but did not spend any significant amount of time in close contact with her.  He denies fevers, body aches, headaches, nasal congestion, rhinorrhea, sore throat, loss of sense of smell or taste, cough, shortness of breath, abdominal pain, nausea, vomiting, diarrhea.  He is a smoker.  He has a history of depression and ADHD.  PMD: Deatra James, MD   All history obtained through ASL interpreter.    Past Medical History:  Diagnosis Date  . Deaf   . Depression     Past Surgical History:  Procedure Laterality Date  . APPENDECTOMY      Family History  Family history unknown: Yes    Social History   Tobacco Use  . Smoking status: Current Every Day Smoker    Packs/day: 1.00    Types: Cigarettes  . Smokeless tobacco: Never Used  Substance Use Topics  . Alcohol use: Yes    Comment: Daily. 3 16oz beer a day. Last drink: 17:00   . Drug use: Yes    Types: Marijuana    Comment: Hydrocodone from streets, last used Thursday night. Last used Marjuana 14:00 today.     No current facility-administered medications for this encounter.   Current Outpatient Medications:  .  amphetamine-dextroamphetamine (ADDERALL XR) 10 MG 24 hr capsule, Take 10 mg by mouth daily., Disp: , Rfl:  .  atomoxetine (STRATTERA) 40 MG capsule, Take 40 mg by mouth every morning., Disp: , Rfl:  .  cyclobenzaprine (FLEXERIL) 10 MG tablet, Take 1 tablet (10 mg total) by mouth 2 (two) times daily as needed for muscle spasms., Disp: 15 tablet, Rfl: 0 .  diclofenac (VOLTAREN) 50 MG EC tablet, Take 1 tablet (50 mg total) by mouth 2 (two) times daily., Disp: 15 tablet, Rfl: 0 .  sertraline (ZOLOFT) 50 MG tablet, Take 1 tablet by mouth daily., Disp: , Rfl:   No Known Allergies   ROS  As noted in  HPI.   Physical Exam  BP 112/73 (BP Location: Left Arm)   Pulse (!) 105   Resp 12   SpO2 100%   Constitutional: Well developed, well nourished, no acute distress Eyes:  EOMI, conjunctiva normal bilaterally HENT: Normocephalic, atraumatic,mucus membranes moist Respiratory: Normal inspiratory effort, lungs clear bilaterally good air movement Cardiovascular: Mild regular tachycardia no murmurs rubs or gallops GI: nondistended skin: No rash, skin intact Musculoskeletal: no deformities Neurologic: Alert & oriented x 3, no focal neuro deficits Psychiatric: Speech and behavior appropriate   ED Course   Medications - No data to display  No orders of the defined types were placed in this encounter.   No results found for this or any previous visit (from the past 24 hour(s)). No results found.  ED Clinical Impression  1. Exposure to COVID-19 virus   2. Encounter for laboratory testing for COVID-19 virus      ED Assessment/Plan  Covid test sent.  Advised patient to quarantine himself until his results came back.  Using the ASL interpreter, advised him to seek medical attention if he started to show symptoms despite a negative test for reevaluation.  Discussed  MDM,  and plan for follow-up with patient. Discussed sn/sx that should prompt return to the ED. patient agrees with plan.   No  orders of the defined types were placed in this encounter.   *This clinic note was created using Dragon dictation software. Therefore, there may be occasional mistakes despite careful proofreading.   ?    Melynda Ripple, MD 04/18/19 1316

## 2019-04-18 NOTE — Discharge Instructions (Addendum)
Your Covid test will take 24 to 48 hours to come back.  It will come through my chart.  We will also contact you if it is positive.  In the meantime, quarantine yourself.  If you start to have symptoms, return here or see your doctor for repeat testing.

## 2019-04-20 LAB — NOVEL CORONAVIRUS, NAA (HOSP ORDER, SEND-OUT TO REF LAB; TAT 18-24 HRS): SARS-CoV-2, NAA: NOT DETECTED

## 2024-03-14 LAB — AMB RESULTS CONSOLE CBG: Glucose: 98

## 2024-03-14 NOTE — Progress Notes (Signed)
Pt declined SDOH. Pt has PCP.

## 2024-04-23 NOTE — Progress Notes (Signed)
 The patient attended a screening event on 03/14/2024 where his BP screening results was 117/77 and non-fasting blood glucose 98. At the event the patient noted he has none insurance  pt noted he does smoke tobacco. Patient declined having any SDOH insecurities. At the event pt stated he has pcp.    Per chart review pt pcp is listed as Vyvyan Sun, MD. And  there are no past or future visits within the past 12 months visible for pt. Post event initial f/u CHW called pt pcp office. The office confirmed that the pt is established with the pcp office however; the pt has not been seen by the pcp since June 2023.The office stated that they will reach out to the pt to schedule him an appointment. Unable to contact pt due to text only. Letter sent with [facility] and [facility] care clinic PCP resource information flyers in case needed by pt. Additional pt f/u to be scheduled per health equity protocol.

## 2024-06-14 NOTE — Progress Notes (Unsigned)
 Per initial f/u interval, The patient attended a screening event on 03/14/2024 where his BP screening results was 117/77 and non-fasting blood glucose 98. At the event the patient noted he has none insurance  pt noted he does smoke tobacco. Patient declined having any SDOH insecurities. At the event pt stated he has pcp.     Per chart review pt pcp is listed as Vyvyan Sun, MD. And  there are no past or future visits within the past 12 months visible for pt. Post event initial f/u CHW called pt pcp office. The office confirmed that the pt is established with the pcp office however; the pt has not been seen by the pcp since June 2023.The office stated that they will reach out to the pt to schedule him an appointment. Unable to contact pt due to text only. Letter sent with [employer] [facility] and [facility] Primary care clinic PCP resource information flyers in case needed by pt. Additional pt f/u to be scheduled per health equity protocol.  During the 60 day f/u interval, the pt does not have any indicated [facility] visible appts since the initial f/u interval. There are currently no upcoming appts indicated at this time. CHW is unable to contact pt via phone due to him needing text only communication as indicated in his chart. Pt has [employer] and [employer] for his insurance. RTE that his Medicaid is not active and his other insurances as elapsed. At the event he indicated no insurance.  60 Day Call Attempt: CHW called PCP office to obtain appt status. CHW unable to reach PCP office at this time.  CHW sent a courtesy email with the letter to the pt including [employer] [facility] [employer], [facility] Primary care clinic PCP resource information flyers, [employer], Medicaid, and [employer], and smoking cessation in case needed by pt.   Additional pt f/u to be scheduled per health equity protocol.
